# Patient Record
Sex: Male | Born: 1985 | Race: White | Hispanic: No | Marital: Single | Smoking: Current every day smoker
Health system: Southern US, Community
[De-identification: ages and names within clinical notes are randomized; demographics above are authoritative.]

## PROBLEM LIST (undated history)

## (undated) DIAGNOSIS — F32A Depression, unspecified: Secondary | ICD-10-CM

## (undated) DIAGNOSIS — F329 Major depressive disorder, single episode, unspecified: Secondary | ICD-10-CM

## (undated) DIAGNOSIS — F419 Anxiety disorder, unspecified: Secondary | ICD-10-CM

## (undated) DIAGNOSIS — R9431 Abnormal electrocardiogram [ECG] [EKG]: Secondary | ICD-10-CM

---
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
## 2012-07-30 ENCOUNTER — Encounter (HOSPITAL_BASED_OUTPATIENT_CLINIC_OR_DEPARTMENT_OTHER): Payer: Self-pay | Admitting: *Deleted

## 2012-07-30 ENCOUNTER — Emergency Department (HOSPITAL_BASED_OUTPATIENT_CLINIC_OR_DEPARTMENT_OTHER)
Admission: EM | Admit: 2012-07-30 | Discharge: 2012-07-30 | Disposition: A | Discharge status: Home / Self Care | Payer: BC Managed Care – PPO | Attending: Emergency Medicine | Admitting: Emergency Medicine

## 2012-07-30 ENCOUNTER — Emergency Department (HOSPITAL_BASED_OUTPATIENT_CLINIC_OR_DEPARTMENT_OTHER): Payer: BC Managed Care – PPO

## 2012-07-30 DIAGNOSIS — A084 Viral intestinal infection, unspecified: Secondary | ICD-10-CM

## 2012-07-30 DIAGNOSIS — F17200 Nicotine dependence, unspecified, uncomplicated: Secondary | ICD-10-CM | POA: Insufficient documentation

## 2012-07-30 DIAGNOSIS — R197 Diarrhea, unspecified: Secondary | ICD-10-CM | POA: Insufficient documentation

## 2012-07-30 DIAGNOSIS — R509 Fever, unspecified: Secondary | ICD-10-CM | POA: Insufficient documentation

## 2012-07-30 DIAGNOSIS — J029 Acute pharyngitis, unspecified: Secondary | ICD-10-CM | POA: Insufficient documentation

## 2012-07-30 DIAGNOSIS — A088 Other specified intestinal infections: Secondary | ICD-10-CM | POA: Insufficient documentation

## 2012-07-30 LAB — CBC WITH DIFFERENTIAL/PLATELET
Basophils Absolute: 0 10*3/uL (ref 0.0–0.1)
Basophils Relative: 0 % (ref 0–1)
Eosinophils Absolute: 0 10*3/uL (ref 0.0–0.7)
Eosinophils Relative: 0 % (ref 0–5)
HCT: 45 % (ref 39.0–52.0)
Hemoglobin: 15.1 g/dL (ref 13.0–17.0)
Lymphocytes Relative: 10 % — ABNORMAL LOW (ref 12–46)
Lymphs Abs: 1.8 10*3/uL (ref 0.7–4.0)
MCH: 29.2 pg (ref 26.0–34.0)
MCHC: 33.6 g/dL (ref 30.0–36.0)
MCV: 86.9 fL (ref 78.0–100.0)
Monocytes Absolute: 1.2 10*3/uL — ABNORMAL HIGH (ref 0.1–1.0)
Monocytes Relative: 7 % (ref 3–12)
Neutro Abs: 14.2 10*3/uL — ABNORMAL HIGH (ref 1.7–7.7)
Neutrophils Relative %: 83 % — ABNORMAL HIGH (ref 43–77)
Platelets: 272 10*3/uL (ref 150–400)
RBC: 5.18 MIL/uL (ref 4.22–5.81)
RDW: 14 % (ref 11.5–15.5)
WBC: 17.3 10*3/uL — ABNORMAL HIGH (ref 4.0–10.5)

## 2012-07-30 LAB — URINALYSIS, ROUTINE W REFLEX MICROSCOPIC
Glucose, UA: NEGATIVE mg/dL
Hgb urine dipstick: NEGATIVE
Ketones, ur: 15 mg/dL — AB
Leukocytes, UA: NEGATIVE
Protein, ur: NEGATIVE mg/dL
pH: 7 (ref 5.0–8.0)

## 2012-07-30 LAB — COMPREHENSIVE METABOLIC PANEL
ALT: 10 U/L (ref 0–53)
AST: 16 U/L (ref 0–37)
Albumin: 4.2 g/dL (ref 3.5–5.2)
Alkaline Phosphatase: 68 U/L (ref 39–117)
BUN: 17 mg/dL (ref 6–23)
CO2: 26 mEq/L (ref 19–32)
Calcium: 10.3 mg/dL (ref 8.4–10.5)
Chloride: 98 mEq/L (ref 96–112)
Creatinine, Ser: 0.7 mg/dL (ref 0.50–1.35)
GFR calc Af Amer: >90 mL/min (ref >90)
GFR calc non Af Amer: >90 mL/min (ref >90)
Glucose, Bld: 143 mg/dL — ABNORMAL HIGH (ref 70–99)
Potassium: 3.4 mEq/L — ABNORMAL LOW (ref 3.5–5.1)
Sodium: 138 mEq/L (ref 135–145)
Total Bilirubin: 0.5 mg/dL (ref 0.3–1.2)
Total Protein: 7.7 g/dL (ref 6.0–8.3)

## 2012-07-30 MED ORDER — ONDANSETRON HCL 4 MG/2ML IJ SOLN
4.0000 mg | Freq: Once | INTRAMUSCULAR | Status: AC
  Administered 2012-07-30: 4 mg via INTRAVENOUS
  Filled 2012-07-30: qty 2

## 2012-07-30 MED ORDER — ONDANSETRON HCL 4 MG PO TABS: 4.0000 mg | ORAL_TABLET | Freq: Three times a day (TID) | ORAL | Status: DC | PRN

## 2012-07-30 MED ORDER — SODIUM CHLORIDE 0.9 % IV BOLUS (SEPSIS)
1000.0000 mL | Freq: Once | INTRAVENOUS | Status: AC
  Administered 2012-07-30: 1000 mL via INTRAVENOUS

## 2012-07-30 MED ORDER — ONDANSETRON HCL 4 MG/2ML IJ SOLN: 4.0000 mg | Freq: Once | INTRAMUSCULAR | Status: AC

## 2012-07-30 MED ORDER — IOHEXOL 300 MG/ML  SOLN
100.0000 mL | Freq: Once | INTRAMUSCULAR | Status: AC | PRN
  Administered 2012-07-30: 100 mL via INTRAVENOUS

## 2012-07-30 MED ORDER — PROMETHAZINE HCL 25 MG/ML IJ SOLN: 12.5000 mg | INTRAMUSCULAR | Status: DC | PRN

## 2012-07-30 MED ORDER — ONDANSETRON HCL 4 MG/2ML IJ SOLN
INTRAMUSCULAR | Status: AC
  Administered 2012-07-30: 4 mg via INTRAVENOUS
  Filled 2012-07-30: qty 2

## 2012-07-30 MED ORDER — PROMETHAZINE HCL 25 MG/ML IJ SOLN
INTRAMUSCULAR | Status: AC
  Administered 2012-07-30: 12.5 mg via INTRAVENOUS
  Filled 2012-07-30: qty 1

## 2012-07-30 MED ORDER — SODIUM CHLORIDE 0.9 % IV SOLN
Freq: Once | INTRAVENOUS | Status: AC
  Administered 2012-07-30: 23:00:00 via INTRAVENOUS

## 2012-07-30 NOTE — ED Notes (Signed)
MD at bedside. 

## 2012-07-30 NOTE — ED Provider Notes (Signed)
History  This chart was scribed for Edward Shi, MD by Edward Mayer, ED Scribe. This patient was seen in room MH10/MH10 and the patient's care was started at 9:24 PM.  CSN: [id number]  Arrival date & time 07/30/12  2116   Chief Complaint  Patient presents with  . Emesis    The history is provided by the patient. No language interpreter was used.   HPI Comments: ABBIE Mayer is a 27 y.o. Male without significant PMH who presents to the Emergency Department complaining of persistent nausea and vomiting of 3 days duration. Pt reports more than 10 episodes of emesis per day for the last 3 days. Pt also states that earlier today he noticed blood streaks in emesis, but he believes this is due to throat irritation. He reports associated fever, diarrhea and sore throat over the last 3 days. Tmax at home was 101 F. Triage Temp is 99.1 F. Pt states that his last episode of diarrhea was yesterday. He denies sick contacts. He denies having any chronic medical conditions and states that he takes no daily medications. He denies abdominal pain, rash, headaches or any other symptoms. Pt denies alcohol use and is a current every day smoker.  PCP- None  History reviewed. No pertinent past medical history.  History reviewed. No pertinent past surgical history.  No family history on file.  History  Substance Use Topics  . Smoking status: Current Every Day Smoker    Types: Cigarettes  . Smokeless tobacco: Never Used  . Alcohol Use: No    Review of Systems  Constitutional: Positive for fever.  HENT: Positive for sore throat.   Gastrointestinal: Positive for nausea, vomiting and diarrhea. Negative for abdominal pain.  Skin: Negative for rash.  Neurological: Negative for headaches.   A complete 10 system review of systems was obtained and all systems are negative except as noted in the HPI and PMH.   Allergies  Review of patient's allergies indicates no known allergies.  Home Medications    Current Outpatient Rx  Name  Route  Sig  Dispense  Refill  . ondansetron (ZOFRAN) 4 MG tablet   Oral   Take 1 tablet (4 mg total) by mouth every 8 (eight) hours as needed for nausea.   20 tablet   0     Triage Vitals: BP 155/95  Pulse 63  Temp(Src) 99.1 F (37.3 C) (Oral)  Resp 20  Wt 150 lb (68.04 kg)  SpO2 98%  Physical Exam  Nursing note and vitals reviewed. Constitutional: He is oriented to person, place, and time. He appears well-developed and well-nourished. No distress.  HENT:  Head: Normocephalic and atraumatic.  Mouth/Throat: No oropharyngeal exudate.  Eyes: Pupils are equal, round, and reactive to light.  Neck: Normal range of motion. Neck supple.  Cardiovascular: Normal rate and intact distal pulses.   Pulmonary/Chest: No respiratory distress.  Abdominal: Soft. Normal appearance and bowel sounds are normal. He exhibits no distension. There is no tenderness. There is no rebound and no guarding.  Musculoskeletal: Normal range of motion.  Neurological: He is alert and oriented to person, place, and time. No cranial nerve deficit.  Skin: Skin is warm and dry. No rash noted.  Psychiatric: He has a normal mood and affect. His behavior is normal.    ED Course  Procedures (including critical care time) Medications  sodium chloride 0.9 % bolus 1,000 mL (0 mLs Intravenous Stopped 07/30/12 2212)  ondansetron (ZOFRAN) injection 4 mg (4 mg Intravenous Given  07/30/12 2138)  sodium chloride 0.9 % bolus 1,000 mL (0 mLs Intravenous Stopped 07/30/12 2303)  ondansetron (ZOFRAN) injection 4 mg (4 mg Intravenous Given 07/30/12 2239)  iohexol (OMNIPAQUE) 300 MG/ML solution 100 mL (100 mLs Intravenous Contrast Given 07/30/12 2301)    DIAGNOSTIC STUDIES: Oxygen Saturation is 98% on RA, normal by my interpretation.    COORDINATION OF CARE: 9:27 PM- Pt advised of plan for treatment with IV fluids and ant-nausea medication and pt agrees.  Medications  sodium chloride 0.9 % bolus  1,000 mL (0 mLs Intravenous Stopped 07/30/12 2212)  ondansetron (ZOFRAN) injection 4 mg (4 mg Intravenous Given 07/30/12 2138)  sodium chloride 0.9 % bolus 1,000 mL (0 mLs Intravenous Stopped 07/30/12 2303)  ondansetron (ZOFRAN) injection 4 mg (4 mg Intravenous Given 07/30/12 2239)  iohexol (OMNIPAQUE) 300 MG/ML solution 100 mL (100 mLs Intravenous Contrast Given 07/30/12 2301)    Labs Reviewed  CBC WITH DIFFERENTIAL - Abnormal; Notable for the following:    WBC 17.3 (*)    Neutrophils Relative % 83 (*)    Neutro Abs 14.2 (*)    Lymphocytes Relative 10 (*)    Monocytes Absolute 1.2 (*)    All other components within normal limits  COMPREHENSIVE METABOLIC PANEL - Abnormal; Notable for the following:    Potassium 3.4 (*)    Glucose, Bld 143 (*)    All other components within normal limits  URINALYSIS, ROUTINE W REFLEX MICROSCOPIC - Abnormal; Notable for the following:    Color, Urine AMBER (*)    Specific Gravity, Urine 1.034 (*)    Bilirubin Urine SMALL (*)    Ketones, ur 15 (*)    All other components within normal limits  LIPASE, BLOOD    Ct Abdomen Pelvis W Contrast  07/30/2012   *RADIOLOGY REPORT*  Clinical Data: Nausea, vomiting and abdominal pain.  Leukocytosis.  CT ABDOMEN AND PELVIS WITH CONTRAST  Technique:  Multidetector CT imaging of the abdomen and pelvis was performed following the standard protocol during bolus administration of intravenous contrast.  Contrast: OMNIPAQUE IOHEXOL 300 MG/ML  SOLN  Comparison: None.  Findings: The visualized lung bases are clear.  The liver and spleen are unremarkable in appearance.  The gallbladder is within normal limits.  The pancreas and adrenal glands are unremarkable.  The kidneys are unremarkable in appearance.  There is no evidence of hydronephrosis.  No renal or ureteral stones are seen.  No perinephric stranding is appreciated.  No free fluid is identified.  The small bowel is unremarkable in appearance.  The stomach is within normal  limits.  No acute vascular abnormalities are seen.  The appendix is normal in caliber, extending into the pelvis. There is no evidence for appendicitis.  The colon is unremarkable in appearance.  The bladder is mildly distended and grossly unremarkable.  The prostate remains normal in size.  No inguinal lymphadenopathy is seen.  No acute osseous abnormalities are identified.  IMPRESSION: Unremarkable contrast-enhanced CT of the abdomen and pelvis.   Original Report Authenticated By: Tonia Ghent, M.D.    1. Viral gastroenteritis     MDM  After treatment in the ED the patient feels back to baseline and wants to go home.       I personally performed the services described in this documentation, which was scribed in my presence. The recorded information has been reviewed and is accurate.     Edward Shi, MD 08/10/12 [phone]

## 2012-07-30 NOTE — ED Notes (Signed)
Reports vomiting since thursday

## 2012-07-30 NOTE — ED Notes (Signed)
Patient transported to CT 

## 2012-07-30 NOTE — ED Notes (Signed)
MD at bedside discussing test results and tx options. Marland Kitchen

## 2013-09-02 ENCOUNTER — Encounter (HOSPITAL_COMMUNITY): Payer: Self-pay | Admitting: Emergency Medicine

## 2013-09-02 ENCOUNTER — Emergency Department (HOSPITAL_COMMUNITY)
Admission: EM | Admit: 2013-09-02 | Discharge: 2013-09-02 | Disposition: A | Discharge status: Home / Self Care | Payer: BC Managed Care – PPO | Attending: Emergency Medicine | Admitting: Emergency Medicine

## 2013-09-02 DIAGNOSIS — R112 Nausea with vomiting, unspecified: Secondary | ICD-10-CM | POA: Insufficient documentation

## 2013-09-02 DIAGNOSIS — R1084 Generalized abdominal pain: Secondary | ICD-10-CM | POA: Insufficient documentation

## 2013-09-02 DIAGNOSIS — F17200 Nicotine dependence, unspecified, uncomplicated: Secondary | ICD-10-CM | POA: Insufficient documentation

## 2013-09-02 LAB — CBC WITH DIFFERENTIAL/PLATELET
BASOS ABS: 0 10*3/uL (ref 0.0–0.1)
BASOS PCT: 0 % (ref 0–1)
EOS ABS: 0 10*3/uL (ref 0.0–0.7)
Eosinophils Relative: 0 % (ref 0–5)
HCT: 47.5 % (ref 39.0–52.0)
Hemoglobin: 16.4 g/dL (ref 13.0–17.0)
LYMPHS ABS: 1.7 10*3/uL (ref 0.7–4.0)
Lymphocytes Relative: 15 % (ref 12–46)
MCH: 29.5 pg (ref 26.0–34.0)
MCHC: 34.5 g/dL (ref 30.0–36.0)
MCV: 85.6 fL (ref 78.0–100.0)
Monocytes Absolute: 0.8 10*3/uL (ref 0.1–1.0)
Monocytes Relative: 7 % (ref 3–12)
NEUTROS PCT: 78 % — AB (ref 43–77)
Neutro Abs: 8.7 10*3/uL — ABNORMAL HIGH (ref 1.7–7.7)
PLATELETS: 297 10*3/uL (ref 150–400)
RBC: 5.55 MIL/uL (ref 4.22–5.81)
RDW: 13.5 % (ref 11.5–15.5)
WBC: 11.1 10*3/uL — ABNORMAL HIGH (ref 4.0–10.5)

## 2013-09-02 LAB — COMPREHENSIVE METABOLIC PANEL
ALBUMIN: 4.8 g/dL (ref 3.5–5.2)
ALK PHOS: 68 U/L (ref 39–117)
ALT: 14 U/L (ref 0–53)
AST: 15 U/L (ref 0–37)
Anion gap: 20 — ABNORMAL HIGH (ref 5–15)
BUN: 20 mg/dL (ref 6–23)
CO2: 24 mEq/L (ref 19–32)
Calcium: 10.2 mg/dL (ref 8.4–10.5)
Chloride: 98 mEq/L (ref 96–112)
Creatinine, Ser: 0.88 mg/dL (ref 0.50–1.35)
GFR calc Af Amer: >90 mL/min (ref >90)
GFR calc non Af Amer: >90 mL/min (ref >90)
Glucose, Bld: 128 mg/dL — ABNORMAL HIGH (ref 70–99)
POTASSIUM: 3.4 meq/L — AB (ref 3.7–5.3)
SODIUM: 142 meq/L (ref 137–147)
TOTAL PROTEIN: 8.5 g/dL — AB (ref 6.0–8.3)
Total Bilirubin: 0.5 mg/dL (ref 0.3–1.2)

## 2013-09-02 LAB — LIPASE, BLOOD: Lipase: 64 U/L — ABNORMAL HIGH (ref 11–59)

## 2013-09-02 MED ORDER — ONDANSETRON 8 MG PO TBDP
8.0000 mg | ORAL_TABLET | Freq: Once | ORAL | Status: AC
  Administered 2013-09-02: 8 mg via ORAL
  Filled 2013-09-02: qty 1

## 2013-09-02 MED ORDER — ONDANSETRON HCL 4 MG/2ML IJ SOLN
4.0000 mg | Freq: Once | INTRAMUSCULAR | Status: AC
  Administered 2013-09-02: 4 mg via INTRAVENOUS
  Filled 2013-09-02: qty 2

## 2013-09-02 MED ORDER — SODIUM CHLORIDE 0.9 % IV BOLUS (SEPSIS)
1000.0000 mL | Freq: Once | INTRAVENOUS | Status: AC
  Administered 2013-09-02: 1000 mL via INTRAVENOUS

## 2013-09-02 MED ORDER — LORAZEPAM 2 MG/ML IJ SOLN
1.0000 mg | Freq: Once | INTRAMUSCULAR | Status: AC
  Administered 2013-09-02: 1 mg via INTRAVENOUS
  Filled 2013-09-02: qty 1

## 2013-09-02 MED ORDER — KETOROLAC TROMETHAMINE 30 MG/ML IJ SOLN
30.0000 mg | Freq: Once | INTRAMUSCULAR | Status: AC
  Administered 2013-09-02: 30 mg via INTRAVENOUS
  Filled 2013-09-02: qty 1

## 2013-09-02 MED ORDER — DICYCLOMINE HCL 10 MG/ML IM SOLN
20.0000 mg | Freq: Once | INTRAMUSCULAR | Status: AC
  Administered 2013-09-02: 20 mg via INTRAMUSCULAR
  Filled 2013-09-02: qty 2

## 2013-09-02 MED ORDER — ONDANSETRON 4 MG PO TBDP: 4.0000 mg | ORAL_TABLET | Freq: Three times a day (TID) | ORAL | Status: DC | PRN

## 2013-09-02 NOTE — ED Notes (Signed)
Bed: [facility] Expected date: 09/02/13 Expected time: 2:53 AM Means of arrival: Ambulance Comments: abd pain

## 2013-09-02 NOTE — ED Provider Notes (Signed)
Pt feels better,  Pain has improved.  Pt feels like he can drink fluids,  Labs reviewed   Wbc's 11.1  Lipase 64.   (probable viral illness, I advised clear liquids x 12 then progress diet.   Rx for phenenergan  Elson AreasLeslie K Hawley Michel, PA-C 09/02/13 [id number]

## 2013-09-02 NOTE — ED Provider Notes (Signed)
CSN: [id number]     Arrival date & time 09/02/13  0303 History   First MD Initiated Contact with Patient 09/02/13 [phone]     Chief Complaint  Patient presents with  . Abdominal Pain  . Nausea  . Emesis     (Consider location/radiation/quality/duration/timing/severity/associated sxs/prior Treatment) Patient is a 28 y.o. male presenting with abdominal pain and vomiting. The history is provided by the patient.  Abdominal Pain Pain location:  Generalized Pain quality: cramping   Pain radiates to:  Does not radiate Pain severity:  Moderate Onset quality:  Gradual Duration:  3 days Timing:  Constant Progression:  Unchanged Chronicity:  New Context: retching   Context: not alcohol use   Relieved by:  None tried Ineffective treatments:  Eating Associated symptoms: nausea and vomiting   Associated symptoms: no cough, no diarrhea and no fever   Emesis Associated symptoms: abdominal pain   Associated symptoms: no diarrhea and no myalgias     History reviewed. No pertinent past medical history. History reviewed. No pertinent past surgical history. No family history on file. History  Substance Use Topics  . Smoking status: Current Every Day Smoker    Types: Cigarettes  . Smokeless tobacco: Never Used  . Alcohol Use: No    Review of Systems  Constitutional: Negative for fever.  Respiratory: Negative for cough.   Gastrointestinal: Positive for nausea, vomiting and abdominal pain. Negative for diarrhea.  Musculoskeletal: Negative for myalgias.  All other systems reviewed and are negative.     Allergies  Review of patient's allergies indicates no known allergies.  Home Medications   Prior to Admission medications   Medication Sig Start Date End Date Taking? Authorizing Provider  bismuth subsalicylate (PEPTO BISMOL) 262 MG/15ML suspension Take 30 mLs by mouth every 6 (six) hours as needed for indigestion.   Yes Historical Provider, MD   BP 149/73  Pulse 63  Temp(Src)  99.2 F (37.3 C) (Oral)  Resp 23  Ht 5\' 5"  (1.651 m)  Wt 150 lb (68.04 kg)  BMI 24.96 kg/m2  SpO2 97% Physical Exam  Nursing note and vitals reviewed. Constitutional: He appears well-nourished. No distress.  HENT:  Head: Normocephalic.  Mouth/Throat: Oropharynx is clear and moist.  Eyes: Pupils are equal, round, and reactive to light.  Neck: Normal range of motion.  Cardiovascular: Normal rate.   Pulmonary/Chest: Effort normal.  Abdominal: Soft. Bowel sounds are normal. He exhibits no distension. There is generalized tenderness.  Musculoskeletal: Normal range of motion.  Neurological: He is alert.  Skin: Skin is warm and dry. No rash noted. No erythema.    ED Course  Procedures (including critical care time) Labs Review Labs Reviewed  CBC WITH DIFFERENTIAL - Abnormal; Notable for the following:    WBC 11.1 (*)    Neutrophils Relative % 78 (*)    Neutro Abs 8.7 (*)    All other components within normal limits  COMPREHENSIVE METABOLIC PANEL - Abnormal; Notable for the following:    Potassium 3.4 (*)    Glucose, Bld 128 (*)    Total Protein 8.5 (*)    Anion gap 20 (*)    All other components within normal limits  LIPASE, BLOOD - Abnormal; Notable for the following:    Lipase 64 (*)    All other components within normal limits    Imaging Review No results found.   EKG Interpretation None      MDM   Final diagnoses:  None     After 500 cc  IV fluids, Toradol and Zofran IV patient still have nausea/vomiting and cramping pain  Will give IV Ativan and IM Bentyl     Arman FilterGail K Camaria Gerald, NP 09/02/13 [id number]

## 2013-09-02 NOTE — ED Notes (Signed)
Bed: [facility]03 Expected date:  Expected time:  Means of arrival:  Comments: 

## 2013-09-02 NOTE — Discharge Instructions (Signed)

## 2013-09-02 NOTE — ED Provider Notes (Signed)
Medical screening examination/treatment/procedure(s) were performed by non-physician practitioner and as supervising physician I was immediately available for consultation/collaboration.   EKG Interpretation None        Loren Raceravid Bodee Lafoe, MD 09/02/13 [phone]

## 2013-09-02 NOTE — ED Notes (Addendum)
Pt reports n/v/d, lower quadrants abd pain since Thursday.

## 2013-09-03 NOTE — ED Provider Notes (Signed)
Medical screening examination/treatment/procedure(s) were performed by non-physician practitioner and as supervising physician I was immediately available for consultation/collaboration.   EKG Interpretation None        Loren Racer, MD 09/03/13 0700

## 2015-04-03 ENCOUNTER — Encounter (HOSPITAL_COMMUNITY): Payer: Self-pay | Admitting: Emergency Medicine

## 2015-04-03 ENCOUNTER — Emergency Department (HOSPITAL_COMMUNITY)
Admission: EM | Admit: 2015-04-03 | Discharge: 2015-04-03 | Disposition: A | Discharge status: Home / Self Care | Payer: Self-pay | Attending: Emergency Medicine | Admitting: Emergency Medicine

## 2015-04-03 ENCOUNTER — Emergency Department (HOSPITAL_COMMUNITY): Payer: Self-pay

## 2015-04-03 DIAGNOSIS — Z8679 Personal history of other diseases of the circulatory system: Secondary | ICD-10-CM | POA: Insufficient documentation

## 2015-04-03 DIAGNOSIS — R001 Bradycardia, unspecified: Secondary | ICD-10-CM | POA: Insufficient documentation

## 2015-04-03 DIAGNOSIS — F17210 Nicotine dependence, cigarettes, uncomplicated: Secondary | ICD-10-CM | POA: Insufficient documentation

## 2015-04-03 DIAGNOSIS — R079 Chest pain, unspecified: Secondary | ICD-10-CM | POA: Insufficient documentation

## 2015-04-03 HISTORY — DX: Abnormal electrocardiogram (ECG) (EKG): R94.31

## 2015-04-03 LAB — BASIC METABOLIC PANEL
ANION GAP: 11 (ref 5–15)
BUN: 15 mg/dL (ref 6–20)
CHLORIDE: 108 mmol/L (ref 101–111)
CO2: 23 mmol/L (ref 22–32)
Calcium: 9.1 mg/dL (ref 8.9–10.3)
Creatinine, Ser: 0.84 mg/dL (ref 0.61–1.24)
GFR calc Af Amer: >60 mL/min (ref >60)
GFR calc non Af Amer: >60 mL/min (ref >60)
Glucose, Bld: 132 mg/dL — ABNORMAL HIGH (ref 65–99)
POTASSIUM: 4.7 mmol/L (ref 3.5–5.1)
Sodium: 142 mmol/L (ref 135–145)

## 2015-04-03 LAB — I-STAT TROPONIN, ED
TROPONIN I, POC: 0 ng/mL (ref 0.00–0.08)
TROPONIN I, POC: 0 ng/mL (ref 0.00–0.08)

## 2015-04-03 LAB — CBC
HEMATOCRIT: 41.7 % (ref 39.0–52.0)
Hemoglobin: 13.5 g/dL (ref 13.0–17.0)
MCH: 28.5 pg (ref 26.0–34.0)
MCHC: 32.4 g/dL (ref 30.0–36.0)
MCV: 88.2 fL (ref 78.0–100.0)
Platelets: 302 10*3/uL (ref 150–400)
RBC: 4.73 MIL/uL (ref 4.22–5.81)
RDW: 13.9 % (ref 11.5–15.5)
WBC: 7.3 10*3/uL (ref 4.0–10.5)

## 2015-04-03 LAB — LIPASE, BLOOD: Lipase: 22 U/L (ref 11–51)

## 2015-04-03 MED ORDER — PROMETHAZINE HCL 12.5 MG PO TABS: 12.5000 mg | ORAL_TABLET | Freq: Three times a day (TID) | ORAL | Status: DC | PRN

## 2015-04-03 MED ORDER — PROMETHAZINE HCL 25 MG/ML IJ SOLN
12.5000 mg | Freq: Once | INTRAMUSCULAR | Status: AC
  Administered 2015-04-03: 12.5 mg via INTRAVENOUS
  Filled 2015-04-03: qty 1

## 2015-04-03 MED ORDER — PANTOPRAZOLE SODIUM 20 MG PO TBEC
20.0000 mg | DELAYED_RELEASE_TABLET | Freq: Once | ORAL | Status: DC
  Filled 2015-04-03: qty 1

## 2015-04-03 NOTE — ED Notes (Signed)
Cp x 2 days pressure type non radiating with some nausea hx of prolonged QT has 20 left ac given 4 zofran iv and 1 nitro per ems pt admits to smoking weed today and has used drugs coke  in the past

## 2015-04-03 NOTE — ED Notes (Signed)
PA at bedside updating patient/family 

## 2015-04-03 NOTE — ED Notes (Signed)
Pt has had 2 episodes of vomiting a red substance. If another instance occurs will do a gastrocult.

## 2015-04-03 NOTE — Discharge Instructions (Signed)
Please read and follow all provided instructions.  Your diagnoses today include:  1. Chest pain, unspecified chest pain type    Tests performed today include:  An EKG of your heart  A chest x-ray  Cardiac enzymes - a blood test for heart muscle damage  Blood counts and electrolytes  Vital signs. See below for your results today.   Medications prescribed:   Take any prescribed medications only as directed.  Follow-up instructions: Please follow-up with your Cardiologist at scheduled appointment for further evaluation of your symptoms.   Return instructions:  SEEK IMMEDIATE MEDICAL ATTENTION IF:  You have severe chest pain, especially if the pain is crushing or pressure-like and spreads to the arms, back, neck, or jaw, or if you have sweating, nausea (feeling sick to your stomach), or shortness of breath. THIS IS AN EMERGENCY. Don't wait to see if the pain will go away. Get medical help at once. Call 911 or 0 (operator). DO NOT drive yourself to the hospital.   Your chest pain gets worse and does not go away with rest.   You have an attack of chest pain lasting longer than usual, despite rest and treatment with the medications your caregiver has prescribed.   You wake from sleep with chest pain or shortness of breath.  You feel dizzy or faint.  You have chest pain not typical of your usual pain for which you originally saw your caregiver.   You have any other emergent concerns regarding your health.  Additional Information: Chest pain comes from many different causes. Your caregiver has diagnosed you as having chest pain that is not specific for one problem, but does not require admission.  You are at low risk for an acute heart condition or other serious illness.   Your vital signs today were: BP 116/72 mmHg   Pulse 45   Resp 14   SpO2 100% If your blood pressure (BP) was elevated above 135/85 this visit, please have this repeated by your doctor within one  month. --------------

## 2015-04-03 NOTE — ED Provider Notes (Signed)
CSN: [id number]     Arrival date & time 04/03/15  1232 History   First MD Initiated Contact with Patient 04/03/15 1304     Chief Complaint  Patient presents with  . 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 Noticed while he was sitting on the couch. Notes pressure. Substernal with no radiation. 10/10 pain. Intermittent. Unsure of duration. No DM, HTN, HLD. Does endorse smoking 1 PPD. Significant FH of MI. EMS given Nitro and Zofran en route. Pt states pain improved with Nitro. No pain currently in ED. No diaphoresis. Notes N/V. No hematemesis. No SOB/ABD pain. No headache. No other symptoms noted.    Past Medical History  Diagnosis Date  . Prolonged QT interval    History reviewed. No pertinent past surgical history. No family history on file. Social History  Substance Use Topics  . Smoking status: Current Every Day Smoker    Types: Cigarettes  . Smokeless tobacco: Never Used  . Alcohol Use: No    Review of Systems ROS reviewed and all are negative for acute change except as noted in the HPI.  Allergies  Review of patient's allergies indicates no known allergies.  Home Medications   Prior to Admission medications   Medication Sig Start Date End Date Taking? Authorizing Provider  bismuth subsalicylate (PEPTO BISMOL) 262 MG/15ML suspension Take 30 mLs by mouth every 6 (six) hours as needed for indigestion.    Historical Provider, MD  ondansetron (ZOFRAN ODT) 4 MG disintegrating tablet Take 1 tablet (4 mg total) by mouth every 8 (eight) hours as needed for nausea or vomiting. 09/02/13   Elson AreasLeslie K Sofia, PA-C   BP 131/84 mmHg  Pulse 44  Resp 21  SpO2 100%   Physical Exam  Constitutional: He is oriented to person, place, and time. He appears well-developed and well-nourished.   HENT:  Head: Normocephalic and atraumatic.  Eyes: EOM are normal. Pupils are equal, round, and reactive to light.  Neck: Normal range of motion. Neck supple. No tracheal deviation present.  Cardiovascular: Regular rhythm, S1 normal, S2 normal, normal heart sounds, intact distal pulses and normal pulses.  Bradycardia present.   No murmur heard. Pulmonary/Chest: Effort normal and breath sounds normal. No respiratory distress. He has no wheezes. He has no rales. He exhibits no tenderness.  Abdominal: Soft. There is no tenderness.  Musculoskeletal: Normal range of motion.  Neurological: He is alert and oriented to person, place, and time.  Skin: Skin is warm and dry.  Psychiatric: He has a normal mood and affect. His behavior is normal. Thought content normal.  Nursing note and vitals reviewed.   ED Course  Procedures (including critical care time) Labs Review Labs Reviewed  BASIC METABOLIC PANEL - Abnormal; Notable for the following:    Glucose, Bld 132 (*)    All other components within normal limits  CBC  LIPASE, BLOOD  I-STAT TROPOININ, ED  Rosezena SensorI-STAT TROPOININ, ED   Imaging Review Dg Chest 2 View  04/03/2015  CLINICAL DATA:  Chest pain for 2 days.  Cough. EXAM: CHEST  2 VIEW COMPARISON:  None. FINDINGS: The lungs are clear. Heart size and pulmonary vascularity are normal. No adenopathy. No bone lesions. No pneumothorax. IMPRESSION: No edema or consolidation. Electronically Signed   By: Bretta BangWilliam  Woodruff III M.D.   On: 04/03/2015 14:28   I have personally reviewed  and evaluated these images and lab results as part of my medical decision-making.   EKG Interpretation   Date/Time:  Wednesday April 03 2015 12:38:56 EDT Ventricular Rate:  46 PR Interval:  140 QRS Duration: 130 QT Interval:  528 QTC Calculation: 462 R Axis:   5 Text Interpretation:  Sinus bradycardia Atrial premature complex  Nonspecific intraventricular conduction delay ST elev, probable normal  early repol  pattern No old tracing to compare Confirmed by BELFI  MD,  MELANIE ([id number]) on 04/03/2015 1:49:18 PM      MDM  I have reviewed and evaluated the relevant laboratory values.I have reviewed and evaluated the relevant imaging studies.I personally evaluated and interpreted the relevant EKG.I have reviewed the relevant previous healthcare records.I have reviewed EMS Documentation.I obtained HPI from historian. Patient discussed with supervising physician  ED Course:  Assessment: Pt is a 29yM presents with CP x2days. Intermittent. Substernal no radiation. Risk Factors Smoking and FH. Given Nitro by EMS with relief of symptoms. No CP in ED. Chest pain is not likely of cardiac or pulmonary etiology d/t presentation, perc negative, VS show bradycardia in high 40s. no tracheal deviation, no JVD or new murmur, RRR, breath sounds equal bilaterally, EKG without acute abnormalities, but shows QT prolongation, negative troponinx2, and negative CXR. Of note, pt had just started methadone after being off x4 days. Zofran and Methadone administration could explain prolonged QT and N/V symptoms. Reported hx of QT prolongation from nurse has appointment with Dr. Algie Coffer tomorrow due to prolong QT. Heart Score 3. Plan is to DC Home with follow up to Dr. Algie Coffer tomorrow. Strict return precautions to the ED is CP becomes exertional, associated with diaphoresis or nausea, radiates to left jaw/arm, worsens or becomes concerning in any way. Pt appears reliable for follow up and is agreeable to discharge. Patient is in no acute distress. Well appearing. Vital Signs are stable. Patient is able to ambulate. Patient able to tolerate PO.  3:37 PM- Spoke with Cardiology. Discussed bradycardia and QT interval. Most likely due to Methadone. Low concern for ACS or admission. Has scheduled follow up with Cardiology tomorrow with Dr. Algie Coffer. Agreed to [place] with DC to Cardiology appointment.     Disposition/Plan:  DC Home Additional  Verbal discharge instructions given and discussed with patient.  Pt Instructed to f/u with PCP in the next 48-72 hours for evaluation and treatment of symptoms. Return precautions given Pt acknowledges and agrees with plan  Supervising Physician Rolan Bucco, MD   Final diagnoses:  Chest pain, unspecified chest pain type       Audry Pili, PA-C 04/03/15 1812  Rolan Bucco, MD 04/05/15 1515

## 2015-04-03 NOTE — Progress Notes (Signed)
Called by Audry Piliyler Mohr, PA-C from the ER. Pt on methadone with prolonged QT and bradycardia. He is asymptomatic from a cardiac perspective. EKG reviewed showing prolonged QT but QTc only mildly prolonged. Will arrange office visit next 1-2 weeks to repeat EKG and make sure QT interval is stable.   Edward Mayer, Edward Mayer 04/03/2015 3:40 PM

## 2015-04-03 NOTE — ED Notes (Signed)
Pt returned from xray with a bag halfway full of vomit.

## 2015-04-03 NOTE — ED Notes (Signed)
Patent able to dress and ambulate independently

## 2015-04-03 NOTE — ED Notes (Signed)
Pt states he does not think he will be able to keep the Protonix oral down.  Will provide after Phenergan has the opportunity to work

## 2015-06-28 DIAGNOSIS — F17210 Nicotine dependence, cigarettes, uncomplicated: Secondary | ICD-10-CM | POA: Insufficient documentation

## 2015-06-28 DIAGNOSIS — F1990 Other psychoactive substance use, unspecified, uncomplicated: Secondary | ICD-10-CM | POA: Insufficient documentation

## 2015-06-28 DIAGNOSIS — F329 Major depressive disorder, single episode, unspecified: Secondary | ICD-10-CM | POA: Insufficient documentation

## 2015-06-28 DIAGNOSIS — F1290 Cannabis use, unspecified, uncomplicated: Secondary | ICD-10-CM | POA: Insufficient documentation

## 2015-06-28 DIAGNOSIS — R45851 Suicidal ideations: Secondary | ICD-10-CM | POA: Insufficient documentation

## 2015-06-29 ENCOUNTER — Encounter (HOSPITAL_COMMUNITY): Payer: Self-pay | Admitting: *Deleted

## 2015-06-29 ENCOUNTER — Encounter (HOSPITAL_COMMUNITY): Payer: Self-pay | Admitting: Emergency Medicine

## 2015-06-29 ENCOUNTER — Emergency Department (HOSPITAL_COMMUNITY)
Admission: EM | Admit: 2015-06-29 | Discharge: 2015-06-29 | Disposition: A | Discharge status: Other Acute Inpatient Hospital | Payer: Self-pay | Attending: Emergency Medicine | Admitting: Emergency Medicine

## 2015-06-29 ENCOUNTER — Observation Stay (HOSPITAL_COMMUNITY)
Admission: AD | Admit: 2015-06-29 | Discharge: 2015-06-29 | Disposition: A | Discharge status: Home / Self Care | Payer: Self-pay | Source: Intra-hospital | Attending: Psychiatry | Admitting: Psychiatry

## 2015-06-29 DIAGNOSIS — F329 Major depressive disorder, single episode, unspecified: Secondary | ICD-10-CM | POA: Insufficient documentation

## 2015-06-29 DIAGNOSIS — F1290 Cannabis use, unspecified, uncomplicated: Secondary | ICD-10-CM | POA: Insufficient documentation

## 2015-06-29 DIAGNOSIS — F419 Anxiety disorder, unspecified: Secondary | ICD-10-CM | POA: Insufficient documentation

## 2015-06-29 DIAGNOSIS — Y902 Blood alcohol level of 40-59 mg/100 ml: Secondary | ICD-10-CM | POA: Insufficient documentation

## 2015-06-29 DIAGNOSIS — I4581 Long QT syndrome: Secondary | ICD-10-CM | POA: Insufficient documentation

## 2015-06-29 DIAGNOSIS — R45851 Suicidal ideations: Secondary | ICD-10-CM

## 2015-06-29 DIAGNOSIS — F1994 Other psychoactive substance use, unspecified with psychoactive substance-induced mood disorder: Secondary | ICD-10-CM | POA: Diagnosis present

## 2015-06-29 DIAGNOSIS — F1094 Alcohol use, unspecified with alcohol-induced mood disorder: Principal | ICD-10-CM | POA: Insufficient documentation

## 2015-06-29 DIAGNOSIS — F1490 Cocaine use, unspecified, uncomplicated: Secondary | ICD-10-CM | POA: Insufficient documentation

## 2015-06-29 DIAGNOSIS — F1590 Other stimulant use, unspecified, uncomplicated: Secondary | ICD-10-CM | POA: Insufficient documentation

## 2015-06-29 HISTORY — DX: Major depressive disorder, single episode, unspecified: F32.9

## 2015-06-29 HISTORY — DX: Anxiety disorder, unspecified: F41.9

## 2015-06-29 HISTORY — DX: Depression, unspecified: F32.A

## 2015-06-29 LAB — RAPID URINE DRUG SCREEN, HOSP PERFORMED
AMPHETAMINES: NOT DETECTED
Barbiturates: NOT DETECTED
Benzodiazepines: NOT DETECTED
Cocaine: POSITIVE — AB
OPIATES: NOT DETECTED
Tetrahydrocannabinol: POSITIVE — AB

## 2015-06-29 LAB — COMPREHENSIVE METABOLIC PANEL
ALK PHOS: 69 U/L (ref 38–126)
ALT: 98 U/L — AB (ref 17–63)
AST: 38 U/L (ref 15–41)
Albumin: 4.4 g/dL (ref 3.5–5.0)
Anion gap: 10 (ref 5–15)
BUN: 19 mg/dL (ref 6–20)
CALCIUM: 9.4 mg/dL (ref 8.9–10.3)
CHLORIDE: 108 mmol/L (ref 101–111)
CO2: 24 mmol/L (ref 22–32)
CREATININE: 0.89 mg/dL (ref 0.61–1.24)
Glucose, Bld: 76 mg/dL (ref 65–99)
Potassium: 3.9 mmol/L (ref 3.5–5.1)
Sodium: 142 mmol/L (ref 135–145)
Total Bilirubin: 0.8 mg/dL (ref 0.3–1.2)
Total Protein: 7.8 g/dL (ref 6.5–8.1)

## 2015-06-29 LAB — URINALYSIS, ROUTINE W REFLEX MICROSCOPIC
Bilirubin Urine: NEGATIVE
Glucose, UA: NEGATIVE mg/dL
Hgb urine dipstick: NEGATIVE
Ketones, ur: NEGATIVE mg/dL
Leukocytes, UA: NEGATIVE
Nitrite: NEGATIVE
Protein, ur: NEGATIVE mg/dL
Specific Gravity, Urine: 1.023 (ref 1.005–1.030)
pH: 6.5 (ref 5.0–8.0)

## 2015-06-29 LAB — CBC
HEMATOCRIT: 46.2 % (ref 39.0–52.0)
HEMOGLOBIN: 15.7 g/dL (ref 13.0–17.0)
MCH: 28.6 pg (ref 26.0–34.0)
MCHC: 34 g/dL (ref 30.0–36.0)
MCV: 84.2 fL (ref 78.0–100.0)
Platelets: 301 10*3/uL (ref 150–400)
RBC: 5.49 MIL/uL (ref 4.22–5.81)
RDW: 13.8 % (ref 11.5–15.5)
WBC: 15.4 10*3/uL — ABNORMAL HIGH (ref 4.0–10.5)

## 2015-06-29 LAB — ACETAMINOPHEN LEVEL: Acetaminophen (Tylenol), Serum: <10 ug/mL — ABNORMAL LOW (ref 10–30)

## 2015-06-29 LAB — ETHANOL: Alcohol, Ethyl (B): 59 mg/dL — ABNORMAL HIGH (ref <5)

## 2015-06-29 LAB — SALICYLATE LEVEL

## 2015-06-29 MED ORDER — LORAZEPAM 1 MG PO TABS: 1.0000 mg | ORAL_TABLET | Freq: Three times a day (TID) | ORAL | Status: DC | PRN

## 2015-06-29 MED ORDER — IBUPROFEN 200 MG PO TABS: 600.0000 mg | ORAL_TABLET | Freq: Three times a day (TID) | ORAL | Status: DC | PRN

## 2015-06-29 MED ORDER — ACETAMINOPHEN 325 MG PO TABS: 650.0000 mg | ORAL_TABLET | Freq: Four times a day (QID) | ORAL | Status: DC | PRN

## 2015-06-29 NOTE — Progress Notes (Signed)
Patient is resting quietly, patient is calm and compliant. He stated that he still felt high levels of depression, anxiety, and hopelessness. He denied SI , HI, and AVH.  Pain 0/1  Patent remains safe with constant observation, support, encouragement, and education offered.  Patient is receptive and compliant, will continue to monitor.

## 2015-06-29 NOTE — BH Assessment (Addendum)
Tele Assessment Note   Edward FettersJeremy C Mayer is an 30 y.o. male Presenting to [facility] reporting suicidal ideation due to losing his daughter. Pt stated "I have suicidal thoughts because I lost my daughter and it all built up". Pt reported that earlier today he had thoughts of cutting his wrist; however he did not act upon them. Pt shared that he has attempted in the past and has had multiple psychiatric hospitalizations. Pt is reporting multiple depressive symptoms and shared that losing his daughter is a stressor. Pt denies HI and AVH at this time.  Pt reported alcohol, cocaine, heroin, marijuana and meth use.   Diagnosis: Substance induced mood disorder   Past Medical History:  Past Medical History  Diagnosis Date  . Prolonged QT interval   . Depression   . Anxiety     History reviewed. No pertinent past surgical history.  Family History: No family history on file.  Social History:  reports that he has been smoking Cigarettes.  He has been smoking about 1.00 pack per day. He has never used smokeless tobacco. He reports that he drinks alcohol. He reports that he uses illicit drugs (Cocaine, Marijuana, and Methamphetamines).  Additional Social History:  Alcohol / Drug Use History of alcohol / drug use?: Yes Substance #1 Name of Substance 1: Alcohol  1 - Age of First Use: 15 1 - Amount (size/oz): 2-40 oz 1 - Frequency: "2x weekly"  1 - Duration: ongoing  1 - Last Use / Amount: 06-29-15 Substance #2 Name of Substance 2: Cocaine  2 - Age of First Use: 16 2 - Amount (size/oz): $30-40 2 - Frequency: "every other day"  2 - Duration: ongoing  2 - Last Use / Amount: 06-28-15 Substance #3 Name of Substance 3: Heroin  3 - Age of First Use: 18 3 - Amount (size/oz): $30-40 3 - Frequency: daily  3 - Duration: ongoing  3 - Last Use / Amount: 1 month ago  Substance #4 Name of Substance 4: THC  4 - Age of First Use: 14 4 - Amount (size/oz): $10-20 4 - Frequency: daily  4 - Duration: ongoing  4  - Last Use / Amount: 06-26-15 Substance #5 Name of Substance 5: Meth  5 - Age of First Use: 20 5 - Amount (size/oz): "a lot"  5 - Frequency: "3-4 times"  5 - Duration: ongoing  5 - Last Use / Amount: 06-26-15  CIWA: CIWA-Ar BP: 122/60 mmHg Pulse Rate: 76 COWS:    PATIENT STRENGTHS: (choose at least two) Average or above average intelligence Motivation for treatment/growth  Allergies: No Known Allergies  Home Medications:  (Not in a hospital admission)  OB/GYN Status:  No LMP for male patient.  General Assessment Data [facility] Assessment: In system Is this a Tele or Face-to-Face Assessment?: Face-to-Face Is this an Initial Assessment or a Re-assessment for this encounter?: Initial Assessment Marital status: Single Living Arrangements: Parent Can pt return to current living arrangement?: Yes Admission Status: Voluntary Is patient capable of signing voluntary admission?: Yes Referral Source: Self/Family/Friend Insurance type: None      Crisis Care Plan Living Arrangements: Parent Name of Psychiatrist: None  Name of Therapist: None   Education Status Is patient currently in school?: No  Risk to self with the past 6 months Suicidal Ideation: Yes-Currently Present Has patient been a risk to self within the past 6 months prior to admission? : Yes Suicidal Intent: Yes-Currently Present Has patient had any suicidal intent within  the past 6 months prior to admission? : Yes Is patient at risk for suicide?: Yes Suicidal Plan?: Yes-Currently Present Has patient had any suicidal plan within the past 6 months prior to admission? : Yes Specify Current Suicidal Plan: Cutting my wrist  Access to Means: Yes Specify Access to Suicidal Means: sharps  What has been your use of drugs/alcohol within the last 12 months?: Alcohol, cocaine, heroin, THC, and meth reported.  Previous Attempts/Gestures: Yes How many times?: 2 Other Self Harm Risks: pt denies   Triggers for Past Attempts: None known Intentional Self Injurious Behavior: None Family Suicide History: Yes (grandpa ) Recent stressful life event(s): Other (Comment) (loss of daughter ) Persecutory voices/beliefs?: Yes Depression: Yes Depression Symptoms: Despondent, Insomnia, Tearfulness, Fatigue, Isolating, Guilt, Loss of interest in usual pleasures, Feeling worthless/self pity, Feeling angry/irritable Substance abuse history and/or treatment for substance abuse?: Yes Suicide prevention information given to non-admitted patients: Not applicable  Risk to Others within the past 6 months Homicidal Ideation: No Does patient have any lifetime risk of violence toward others beyond the six months prior to admission? : No Thoughts of Harm to Others: No Current Homicidal Intent: No Current Homicidal Plan: No Access to Homicidal Means: No Identified Victim: N/A History of harm to others?: No Assessment of Violence: None Noted Violent Behavior Description: No violent behavior observed.  Does patient have access to weapons?: No Criminal Charges Pending?: Yes Describe Pending Criminal Charges: dwlr not impaired  Does patient have a court date: Yes Court Date: 07/12/15 Is patient on probation?: No  Psychosis Hallucinations: None noted Delusions: None noted  Mental Status Report Appearance/Hygiene: Unremarkable, In scrubs Eye Contact: Poor Motor Activity: Freedom of movement Speech: Logical/coherent Level of Consciousness: Quiet/awake Mood: Pleasant Affect: Appropriate to circumstance Anxiety Level: None Thought Processes: Relevant, Coherent Judgement: Unimpaired Orientation: Appropriate for developmental age Obsessive Compulsive Thoughts/Behaviors: None  Cognitive Functioning Concentration: Decreased Memory: Recent Intact, Remote Intact IQ: Average Insight: Fair Impulse Control: Good Appetite: Good Weight Loss: 0 Weight Gain: 0 Sleep: No Change Total Hours of Sleep:  4 Vegetative Symptoms: None  ADLScreening [facility] Assessment Services) Patient's cognitive ability adequate to safely complete daily activities?: Yes Patient able to express need for assistance with ADLs?: Yes Independently performs ADLs?: Yes (appropriate for developmental age)  Prior Inpatient Therapy Prior Inpatient Therapy: Yes Prior Therapy Dates: 2009-present Prior Therapy Facilty/Provider(s): [facility]. [facility]. [facility] Barre Reason for Treatment: substance   Prior Outpatient Therapy Prior Outpatient Therapy: Yes Prior Therapy Dates: 2017 Prior Therapy Facilty/Provider(s): [facility] Reason for Treatment: [facility]  Does patient have an ACCT team?: No Does patient have Intensive In-House Services?  : No Does patient have [facility] services? : No Does patient have P4[facility] services?: No  ADL Screening (condition at time of admission) Patient's cognitive ability adequate to safely complete daily activities?: Yes Is the patient deaf or have difficulty hearing?: No Does the patient have difficulty seeing, even when wearing glasses/contacts?: No Does the patient have difficulty concentrating, remembering, or making decisions?: No Patient able to express need for assistance with ADLs?: Yes Does the patient have difficulty dressing or bathing?: No Independently performs ADLs?: Yes (appropriate for developmental age) Does the patient have difficulty walking or climbing stairs?: No       Abuse/Neglect Assessment (Assessment to be complete while patient is alone) Physical Abuse: Denies Verbal Abuse: Denies Sexual Abuse: Denies Exploitation of patient/patient's resources: Denies Self-Neglect: Denies     Merchant navy officer (For Healthcare) Does patient have an advance directive?: No Would patient like information  on creating an advanced directive?: No - patient declined information    Additional Information 1:1 In Past 12 Months?: No CIRT Risk: No Elopement Risk: No Does patient have medical  clearance?: Yes     Disposition:  Disposition Initial Assessment Completed for this Encounter: Yes Disposition of Patient: Referred to (OBS )  Sherrise Liberto S 06/29/2015 3:04 AM

## 2015-06-29 NOTE — Discharge Summary (Signed)
  Destine Ryser is a 30 year old male who [facility] depression that sLeane Platttarted about six months ago due to losing custody of his daughter. He attributes this to "domestic issues" between him and his ex wife. Patient denies any previous psychiatric history. Patient states "I drank two forty ounces last night. I let things build up. I was thinking about my daughter. I normally do not drink much and when I do it really affects me. I am no longer having those thoughts. I feel ready to go. I live with my mother. I should have just talked to her. I could have called a hotline but I have no minutes on my phone. So I just called 911 instead. [facility] and get back on neurontin which helped my anxiety. I have not been using heroine lately. Still using cocaine but know I need to stop that. But I think I was only suicidal because of the alcohol that I drank." On admission his urine drug screen was positive for cocaine and marijuana. His alcohol level on admission was 59. Patient does not meet criteria for inpatient admission and also denies a need verbally. He reports [facility] and a bus pass to get to his mother's house. Riki RuskJeremy denied suicidal ideation several times during assessment and appears psychiatrically stable for discharge. Patient left [facility] in no acute distress and was provided with outpatient resources ([facility]).   Patient seen by Fransisca KaufmannLaura Davis.

## 2015-06-29 NOTE — BH Assessment (Signed)
Assessment completed. Consulted Alberteen SamFran Hobson, NP who recommended that observation unit.

## 2015-06-29 NOTE — H&P (Signed)
[place] [facility] Provider Admission PAA/H&P  Patient Identification: Edward Mayer MRN:  [id number] Date of Evaluation:  06/29/2015 Chief Complaint:  Patient states "I got depressed when I was drinking last night."  Principal Diagnosis: Substance induced mood disorder ([place]) Diagnosis:   Patient Active Problem List   Diagnosis Date Noted  . Substance induced mood disorder [facility]) [F19.94] 06/29/2015   History of Present Illness:    Edward Mayer is a 30 year old male who presented to [facility] due to depression that started about six months ago due to losing custody of his daughter. He attributes this to "domestic issues" between him and his ex wife. Patient denies any previous psychiatric history. Patient states "I drank two forty ounces last night. I let things build up. I was thinking about my daughter. I normally do not drink much and when I do it really affects me. I am no longer having those thoughts. I feel ready to go. I live with my mother. I should have just talked to her. I could have called a hotline but I have no minutes on my phone. So I just called 911 instead. I would like to be seen at [facility] and get back on neurontin which helped my anxiety. I have not been using heroine lately. Still using cocaine but know I need to stop that. But I think I was only suicidal because of the alcohol that I drank." On admission his urine drug screen was positive for cocaine and marijuana. His alcohol level on admission was 59. Patient does not meet criteria for inpatient admission and also denies a need verbally. He reports needing follow up information for [facility] and a bus pass to get to his mother's house. Verle denied suicidal ideation several times during assessment and appears psychiatrically stable for discharge.   Associated Signs/Symptoms: Depression Symptoms:  depressed mood, fatigue, feelings of worthlessness/guilt, anxiety, loss of energy/fatigue, (Hypo) Manic Symptoms:   Denies Anxiety Symptoms:  Excessive Worry, Psychotic Symptoms:  Denies PTSD Symptoms: Negative Total Time spent with patient: 45 minutes  Past Psychiatric History: Patient denies  Is the patient at risk to self? No.  Has the patient been a risk to self in the past 6 months? No.  Has the patient been a risk to self within the distant past? Yes.    Is the patient a risk to others? No.  Has the patient been a risk to others in the past 6 months? No.  Has the patient been a risk to others within the distant past? No.   Prior Inpatient Therapy:   Yes Prior Outpatient Therapy:  Yes was receiving treatment at [facility] until a month ago  Alcohol Screening: 1. How often do you have a drink containing alcohol?: Monthly or less 2. How many drinks containing alcohol do you have on a typical day when you are drinking?: 1 or 2 3. How often do you have six or more drinks on one occasion?: Never Preliminary Score: 0 9. Have you or someone else been injured as a result of your drinking?: No 10. Has a relative or friend or a doctor or another health worker been concerned about your drinking or suggested you cut down?: No Alcohol Use Disorder Identification Test Final Score (AUDIT): 1 Substance Abuse History in the last 12 months:  Yes.   Consequences of Substance Abuse: Patient reports that alcohol/cocaine induce depressive symptoms.  Previous Psychotropic Medications: Yes Patient states "Effexor and Wellbutrin made me shake."  Psychological Evaluations: No  Past  Medical History:  Past Medical History  Diagnosis Date  . Prolonged QT interval   . Depression   . Anxiety    History reviewed. No pertinent past surgical history. Family History: History reviewed. No pertinent family history. Family Psychiatric History: Reports substance abuse problems on both sides of his family  Tobacco Screening: Smokes one pack per day  Social History:  History  Alcohol Use  . Yes    Comment: few a week       History  Drug Use  . Yes  . Special: Cocaine, Marijuana, Methamphetamines    Additional Social History:                           Allergies:  No Known Allergies Lab Results:  Results for orders placed or performed during the hospital encounter of 06/29/15 (from the past 48 hour(s))  Comprehensive metabolic panel     Status: Abnormal   Collection Time: 06/29/15 12:39 AM  Result Value Ref Range   Sodium 142 135 - 145 mmol/L   Potassium 3.9 3.5 - 5.1 mmol/L   Chloride 108 101 - 111 mmol/L   CO2 24 22 - 32 mmol/L   Glucose, Bld 76 65 - 99 mg/dL   BUN 19 6 - 20 mg/dL   Creatinine, Ser 0.89 0.61 - 1.24 mg/dL   Calcium 9.4 8.9 - 10.3 mg/dL   Total Protein 7.8 6.5 - 8.1 g/dL   Albumin 4.4 3.5 - 5.0 g/dL   AST 38 15 - 41 U/L   ALT 98 (H) 17 - 63 U/L   Alkaline Phosphatase 69 38 - 126 U/L   Total Bilirubin 0.8 0.3 - 1.2 mg/dL   GFR calc non Af Amer >60 >60 mL/min   GFR calc Af Amer >60 >60 mL/min    Comment: (NOTE) The eGFR has been calculated using the CKD EPI equation. This calculation has not been validated in all clinical situations. eGFR's persistently <60 mL/min signify possible Chronic Kidney Disease.    Anion gap 10 5 - 15  Ethanol     Status: Abnormal   Collection Time: 06/29/15 12:39 AM  Result Value Ref Range   Alcohol, Ethyl (B) 59 (H) <5 mg/dL    Comment:        LOWEST DETECTABLE LIMIT FOR SERUM ALCOHOL IS 5 mg/dL FOR MEDICAL PURPOSES ONLY   Salicylate level     Status: None   Collection Time: 06/29/15 12:39 AM  Result Value Ref Range   Salicylate Lvl <5.1 2.8 - 30.0 mg/dL  Acetaminophen level     Status: Abnormal   Collection Time: 06/29/15 12:39 AM  Result Value Ref Range   Acetaminophen (Tylenol), Serum <10 (L) 10 - 30 ug/mL    Comment:        THERAPEUTIC CONCENTRATIONS VARY SIGNIFICANTLY. A RANGE OF 10-30 ug/mL MAY BE AN EFFECTIVE CONCENTRATION FOR MANY PATIENTS. HOWEVER, SOME ARE BEST TREATED AT CONCENTRATIONS OUTSIDE  THIS RANGE. ACETAMINOPHEN CONCENTRATIONS >150 ug/mL AT 4 HOURS AFTER INGESTION AND >50 ug/mL AT 12 HOURS AFTER INGESTION ARE OFTEN ASSOCIATED WITH TOXIC REACTIONS.   cbc     Status: Abnormal   Collection Time: 06/29/15 12:39 AM  Result Value Ref Range   WBC 15.4 (H) 4.0 - 10.5 K/uL   RBC 5.49 4.22 - 5.81 MIL/uL   Hemoglobin 15.7 13.0 - 17.0 g/dL   HCT 46.2 39.0 - 52.0 %   MCV 84.2 78.0 - 100.0 fL   MCH  28.6 26.0 - 34.0 pg   MCHC 34.0 30.0 - 36.0 g/dL   RDW 13.8 11.5 - 15.5 %   Platelets 301 150 - 400 K/uL  Rapid urine drug screen (hospital performed)     Status: Abnormal   Collection Time: 06/29/15  4:18 AM  Result Value Ref Range   Opiates NONE DETECTED NONE DETECTED   Cocaine POSITIVE (A) NONE DETECTED   Benzodiazepines NONE DETECTED NONE DETECTED   Amphetamines NONE DETECTED NONE DETECTED   Tetrahydrocannabinol POSITIVE (A) NONE DETECTED   Barbiturates NONE DETECTED NONE DETECTED    Comment:        DRUG SCREEN FOR MEDICAL PURPOSES ONLY.  IF CONFIRMATION IS NEEDED FOR ANY PURPOSE, NOTIFY LAB WITHIN 5 DAYS.        LOWEST DETECTABLE LIMITS FOR URINE DRUG SCREEN Drug Class       Cutoff (ng/mL) Amphetamine      1000 Barbiturate      200 Benzodiazepine   818 Tricyclics       563 Opiates          300 Cocaine          300 THC              50   Urinalysis, Routine w reflex microscopic (not at [facility])     Status: Abnormal   Collection Time: 06/29/15  4:18 AM  Result Value Ref Range   Color, Urine YELLOW YELLOW   APPearance CLOUDY (A) CLEAR   Specific Gravity, Urine 1.023 1.005 - 1.030   pH 6.5 5.0 - 8.0   Glucose, UA NEGATIVE NEGATIVE mg/dL   Hgb urine dipstick NEGATIVE NEGATIVE   Bilirubin Urine NEGATIVE NEGATIVE   Ketones, ur NEGATIVE NEGATIVE mg/dL   Protein, ur NEGATIVE NEGATIVE mg/dL   Nitrite NEGATIVE NEGATIVE   Leukocytes, UA NEGATIVE NEGATIVE    Comment: MICROSCOPIC NOT DONE ON URINES WITH NEGATIVE PROTEIN, BLOOD, LEUKOCYTES, NITRITE, OR GLUCOSE <1000  mg/dL.    Blood Alcohol level:  Lab Results  Component Value Date   ETH 59* 14/97/0263    Metabolic Disorder Labs:  No results found for: HGBA1C, MPG No results found for: PROLACTIN No results found for: CHOL, TRIG, HDL, CHOLHDL, VLDL, LDLCALC  Current Medications: Current Facility-Administered Medications  Medication Dose Route Frequency Provider Last Rate Last Dose  . acetaminophen (TYLENOL) tablet 650 mg  650 mg Oral Q6H PRN Lurena Nida, NP       PTA Medications: Prescriptions prior to admission  Medication Sig Dispense Refill Last Dose  . promethazine (PHENERGAN) 12.5 MG tablet Take 1 tablet (12.5 mg total) by mouth every 8 (eight) hours as needed for nausea or vomiting. (Patient not taking: Reported on 06/29/2015) 10 tablet 0 Completed Course at Unknown time    Musculoskeletal: Strength & Muscle Tone: within normal limits Gait & Station: normal Patient leans: N/A  Psychiatric Specialty Exam: Physical Exam  Review of Systems  Psychiatric/Behavioral: Positive for depression (Stable ) and substance abuse. Negative for suicidal ideas, hallucinations and memory loss. The patient is not nervous/anxious and does not have insomnia.     Blood pressure 102/68, pulse 77, temperature 98.3 F (36.8 C), temperature source Oral, resp. rate 18, height 5' 5"  (1.651 m), weight 68.04 kg (150 lb), SpO2 99 %.Body mass index is 24.96 kg/(m^2).  General Appearance: Casual  Eye Contact:  Good  Speech:  Clear and Coherent  Volume:  Normal  Mood:  Euthymic  Affect:  Appropriate  Thought Process:  Coherent and  Goal Directed  Orientation:  Full (Time, Place, and Person)  Thought Content:  WDL  Suicidal Thoughts:  No  Homicidal Thoughts:  No  Memory:  Immediate;   Good Recent;   Good Remote;   Good  Judgement:  Impaired  Insight:  Shallow  Psychomotor Activity:  Normal  Concentration:  Concentration: Good and Attention Span: Good  Recall:  Good  Fund of Knowledge:  Good  Language:   Good  Akathisia:  No  Handed:  Right  AIMS (if indicated):     Assets:  Communication Skills Desire for Improvement Housing Leisure Time Physical Health Resilience  ADL's:  Intact  Cognition:  WNL  Sleep:         Treatment Plan Summary: Plan Currently denies SI, motivated to follow up with [facility] after discharge   Observation Level/Precautions:  Continuous Observation Laboratory:  CBC Chemistry Profile UDS Psychotherapy:  Individual  Medications:  None Consultations:  None Discharge Concerns:  Continued substance abuse Estimated LOS: 24-48 hours Other:  Provide patient with outpatient resources at [facility] following discharge     Elmarie Shiley, NP 6/17/20172:44 PM I agreed with findings and treatment plan of this patient

## 2015-06-29 NOTE — [facility] Counselor (Signed)
Spoke with pt. During lunch, and this afternoon. Pt was seen by Vernona RiegerLaura, NP. Pt to be d/c today and request outpatient resources. Per [facility] pt. [facility].   Zackry Deines K. Sherlon HandingHarris, LCAS-A, LPC-A, [facility]  Counselor 06/29/2015 2:26 PM

## 2015-06-29 NOTE — Progress Notes (Addendum)
[facility]. On admission, patient appear tired and sleepy. Patient stated "I'm here because of depression and anxiety. I have been depressed for 6 months since I lost my daughter to CPS. I can't get over it. I was feeling too bad yesterday that I called a friend who called the police and [facility]". Patient denied pain, AH/VH at this time. Denies SI stated "I feel safe here". Patient verbally contracted for safety. Patient was calm and cooperative during the admission process.  A: Skin was assessed, no contraband found. Tattoos noted on both hands, right leg, upper back. No bruises/wounds noted. POC and unit policies explained and understanding verbalized. Consents obtained. Refused food and fluids offered, stated "I will wait till breakfast. Just want to lie down.   R: Patient had no additional questions or concerns.

## 2015-06-29 NOTE — ED Provider Notes (Signed)
CSN: [id number]     Arrival date & time 06/28/15  2340 History   First MD Initiated Contact with Patient 06/29/15 0134     Chief Complaint  Patient presents with  . Suicidal     (Consider location/radiation/quality/duration/timing/severity/associated sxs/prior Treatment) HPI   Edward Mayer is a 30 y.o M with a pmhx of Depression and anxiety who presents to the ED today complaining of suicidal ideation. Patient states that he is having thoughts of of harming himself for the last 6 months but in the last 2 weeks he states he felt that it was more serious. He states that he has now stated that he made do something to actually harm himself. He denies having any active plan. He reports alcohol, cocaine and heroin use. Last use was yesterday. He denies any HI, hallucinations.  Past Medical History  Diagnosis Date  . Prolonged QT interval   . Depression   . Anxiety    History reviewed. No pertinent past surgical history. No family history on file. Social History  Substance Use Topics  . Smoking status: Current Every Day Smoker -- 1.00 packs/day    Types: Cigarettes  . Smokeless tobacco: Never Used  . Alcohol Use: Yes     Comment: few a week     Review of Systems  All other systems reviewed and are negative.     Allergies  Review of patient's allergies indicates no known allergies.  Home Medications   Prior to Admission medications   Medication Sig Start Date End Date Taking? Authorizing Provider  promethazine (PHENERGAN) 12.5 MG tablet Take 1 tablet (12.5 mg total) by mouth every 8 (eight) hours as needed for nausea or vomiting. Patient not taking: Reported on 06/29/2015 04/03/15   Audry Pili, PA-C   BP 122/60 mmHg  Pulse 76  Temp(Src) 98.5 F (36.9 C) (Oral)  Resp 15  Ht  (1.651 m)  Wt 68.04 kg  BMI 24.96 kg/m2  SpO2 96% Physical Exam  Constitutional: He is oriented to person, place, and time. He appears well-developed and well-nourished. No distress.  HENT:   Head: Normocephalic and atraumatic.  Mouth/Throat: No oropharyngeal exudate.  Eyes: Conjunctivae and EOM are normal. Pupils are equal, round, and reactive to light. Right eye exhibits no discharge. Left eye exhibits no discharge. No scleral icterus.  Cardiovascular: Normal rate, regular rhythm, normal heart sounds and intact distal pulses.  Exam reveals no gallop and no friction rub.   No murmur heard. Pulmonary/Chest: Effort normal and breath sounds normal. No respiratory distress. He has no wheezes. He has no rales. He exhibits no tenderness.  Abdominal: Soft. He exhibits no distension. There is no tenderness. There is no guarding.  Musculoskeletal: Normal range of motion. He exhibits no edema.  Neurological: He is alert and oriented to person, place, and time.  Skin: Skin is warm and dry. No rash noted. He is not diaphoretic. No erythema. No pallor.  Psychiatric: He has a normal mood and affect. His speech is normal and behavior is normal. Judgment normal. Cognition and memory are normal. He expresses suicidal ideation. He expresses no homicidal ideation.  Nursing note and vitals reviewed.   ED Course  Procedures (including critical care time) Labs Review Labs Reviewed  COMPREHENSIVE METABOLIC PANEL - Abnormal; Notable for the following:    ALT 98 (*)    All other components within normal limits  ETHANOL - Abnormal; Notable for the following:    Alcohol, Ethyl (B) 59 (*)    All  other components within normal limits  ACETAMINOPHEN LEVEL - Abnormal; Notable for the following:    Acetaminophen (Tylenol), Serum <10 (*)    All other components within normal limits  CBC - Abnormal; Notable for the following:    WBC 15.4 (*)    All other components within normal limits  SALICYLATE LEVEL  URINE RAPID DRUG SCREEN, HOSP PERFORMED  URINALYSIS, ROUTINE W REFLEX MICROSCOPIC (NOT AT [facility])    Imaging Review No results found. I have personally reviewed and evaluated these images and lab  results as part of my medical decision-making.   EKG Interpretation None      MDM   Final diagnoses:  Suicidal ideation    Patient presents with complaints of suicidal ideation. No HI or hallucinations. Last use of cocaine and heroin was yesterday. He denies any other symptoms. Mild leukocytosis present, this is nonspecific. No symptoms suggestive of active infection. Feel that he is medically cleared. Patient was placed in psych hold and will await TTS consult.     Lester KinsmanSamantha Tripp [place], PA-C 06/29/15 0145  Rolland PorterMark James, MD 07/06/15 1351

## 2015-06-29 NOTE — Discharge Instructions (Signed)
You are encouraged to follow [facility] for medication management and continuation of care. Also, you are encouraged to follow up with [facility] for follow up and outpatient substance abuse treatment.

## 2015-06-29 NOTE — ED Notes (Signed)
Pt wanded by security. Sitter at bedside.  Pt given sandwich and glass of water.

## 2015-06-29 NOTE — Progress Notes (Signed)
[facility] INPATIENT:  Family/Significant Other Suicide Prevention Education  Suicide Prevention Education:  Patient Refusal for Family/Significant Other Suicide Prevention Education: The patient Edward FettersJeremy C Mayer has refused to provide written consent for family/significant other to be provided Family/Significant Other Suicide Prevention Education during admission and/or prior to discharge.  Physician notified.  Edward Mayer, Edward Mayer B 06/29/2015, 6:40 AM

## 2015-06-29 NOTE — BH Assessment (Signed)
PT has been accepted to the observation unit Bed 2 (Dr. Lucianne MussKumar). Informed Dr. Judd Lienelo of the recommendation.

## 2015-06-29 NOTE — ED Notes (Signed)
Pt stated he began feeling depressed about 6 months ago when he lost his daughter to CPS due to "domestic issues" between him and his ex wife. States he is having SI with plan but denies intent right now to slit his wrist or jump off of a bridge.

## 2021-02-05 ENCOUNTER — Encounter (HOSPITAL_COMMUNITY): Payer: Self-pay

## 2021-02-05 ENCOUNTER — Other Ambulatory Visit: Payer: Self-pay

## 2021-02-05 ENCOUNTER — Inpatient Hospital Stay (HOSPITAL_COMMUNITY)
Admission: EM | Admit: 2021-02-05 | Discharge: 2021-02-08 | DRG: 380 | Disposition: A | Discharge status: Home / Self Care | Payer: 59 | Attending: Internal Medicine | Admitting: Internal Medicine

## 2021-02-05 DIAGNOSIS — K209 Esophagitis, unspecified without bleeding: Secondary | ICD-10-CM | POA: Diagnosis present

## 2021-02-05 DIAGNOSIS — K2970 Gastritis, unspecified, without bleeding: Secondary | ICD-10-CM

## 2021-02-05 DIAGNOSIS — R112 Nausea with vomiting, unspecified: Secondary | ICD-10-CM | POA: Diagnosis not present

## 2021-02-05 DIAGNOSIS — R1310 Dysphagia, unspecified: Secondary | ICD-10-CM

## 2021-02-05 DIAGNOSIS — R011 Cardiac murmur, unspecified: Secondary | ICD-10-CM | POA: Diagnosis present

## 2021-02-05 DIAGNOSIS — D500 Iron deficiency anemia secondary to blood loss (chronic): Secondary | ICD-10-CM

## 2021-02-05 DIAGNOSIS — F1210 Cannabis abuse, uncomplicated: Secondary | ICD-10-CM | POA: Diagnosis present

## 2021-02-05 DIAGNOSIS — K2210 Ulcer of esophagus without bleeding: Principal | ICD-10-CM | POA: Diagnosis present

## 2021-02-05 DIAGNOSIS — I4901 Ventricular fibrillation: Secondary | ICD-10-CM | POA: Diagnosis present

## 2021-02-05 DIAGNOSIS — I4581 Long QT syndrome: Secondary | ICD-10-CM

## 2021-02-05 DIAGNOSIS — I5022 Chronic systolic (congestive) heart failure: Secondary | ICD-10-CM

## 2021-02-05 DIAGNOSIS — F419 Anxiety disorder, unspecified: Secondary | ICD-10-CM | POA: Diagnosis present

## 2021-02-05 DIAGNOSIS — Z8674 Personal history of sudden cardiac arrest: Secondary | ICD-10-CM

## 2021-02-05 DIAGNOSIS — T471X6A Underdosing of other antacids and anti-gastric-secretion drugs, initial encounter: Secondary | ICD-10-CM | POA: Diagnosis present

## 2021-02-05 DIAGNOSIS — F1910 Other psychoactive substance abuse, uncomplicated: Secondary | ICD-10-CM | POA: Insufficient documentation

## 2021-02-05 DIAGNOSIS — Z20822 Contact with and (suspected) exposure to covid-19: Secondary | ICD-10-CM | POA: Diagnosis present

## 2021-02-05 DIAGNOSIS — Z91120 Patient's intentional underdosing of medication regimen due to financial hardship: Secondary | ICD-10-CM

## 2021-02-05 DIAGNOSIS — Z8719 Personal history of other diseases of the digestive system: Secondary | ICD-10-CM

## 2021-02-05 DIAGNOSIS — F17210 Nicotine dependence, cigarettes, uncomplicated: Secondary | ICD-10-CM | POA: Diagnosis present

## 2021-02-05 DIAGNOSIS — Z79899 Other long term (current) drug therapy: Secondary | ICD-10-CM

## 2021-02-05 DIAGNOSIS — F32A Depression, unspecified: Secondary | ICD-10-CM | POA: Diagnosis present

## 2021-02-05 LAB — CBC
HCT: 34.8 % — ABNORMAL LOW (ref 39.0–52.0)
Hemoglobin: 10.6 g/dL — ABNORMAL LOW (ref 13.0–17.0)
MCH: 24.5 pg — ABNORMAL LOW (ref 26.0–34.0)
MCHC: 30.5 g/dL (ref 30.0–36.0)
MCV: 80.6 fL (ref 80.0–100.0)
Platelets: 453 10*3/uL — ABNORMAL HIGH (ref 150–400)
RBC: 4.32 MIL/uL (ref 4.22–5.81)
RDW: 16.3 % — ABNORMAL HIGH (ref 11.5–15.5)
WBC: 5.8 10*3/uL (ref 4.0–10.5)
nRBC: 0 % (ref 0.0–0.2)

## 2021-02-05 LAB — BASIC METABOLIC PANEL
Anion gap: 6 (ref 5–15)
BUN: 16 mg/dL (ref 6–20)
CO2: 26 mmol/L (ref 22–32)
Calcium: 9.3 mg/dL (ref 8.9–10.3)
Chloride: 107 mmol/L (ref 98–111)
Creatinine, Ser: 0.72 mg/dL (ref 0.61–1.24)
GFR, Estimated: >60 mL/min (ref >60)
Glucose, Bld: 101 mg/dL — ABNORMAL HIGH (ref 70–99)
Potassium: 4.4 mmol/L (ref 3.5–5.1)
Sodium: 139 mmol/L (ref 135–145)

## 2021-02-05 LAB — RAPID URINE DRUG SCREEN, HOSP PERFORMED
Amphetamines: NOT DETECTED
Barbiturates: NOT DETECTED
Benzodiazepines: NOT DETECTED
Cocaine: NOT DETECTED
Opiates: NOT DETECTED
Tetrahydrocannabinol: POSITIVE — AB

## 2021-02-05 LAB — TYPE AND SCREEN
ABO/RH(D): A POS
Antibody Screen: NEGATIVE

## 2021-02-05 LAB — ETHANOL: Alcohol, Ethyl (B): <10 mg/dL (ref <10)

## 2021-02-05 LAB — PROTIME-INR
INR: 0.9 (ref 0.8–1.2)
Prothrombin Time: 12.2 seconds (ref 11.4–15.2)

## 2021-02-05 LAB — LIPASE, BLOOD: Lipase: 63 U/L — ABNORMAL HIGH (ref 11–51)

## 2021-02-05 MED ORDER — PANTOPRAZOLE SODIUM 40 MG IV SOLR
40.0000 mg | Freq: Once | INTRAVENOUS | Status: AC
Start: 2021-02-05 — End: 2021-02-05
  Administered 2021-02-05: 23:00:00 40 mg via INTRAVENOUS
  Filled 2021-02-05: qty 40

## 2021-02-05 MED ORDER — ONDANSETRON HCL 4 MG/2ML IJ SOLN: 4.0000 mg | Freq: Once | INTRAMUSCULAR | Status: DC

## 2021-02-05 MED ORDER — ALUM & MAG HYDROXIDE-SIMETH 200-200-20 MG/5ML PO SUSP
30.0000 mL | Freq: Once | ORAL | Status: AC
Start: 2021-02-05 — End: 2021-02-05
  Administered 2021-02-05: 23:00:00 30 mL via ORAL
  Filled 2021-02-05: qty 30

## 2021-02-05 MED ORDER — LIDOCAINE VISCOUS HCL 2 % MT SOLN
15.0000 mL | Freq: Once | OROMUCOSAL | Status: AC
  Administered 2021-02-05: 23:00:00 15 mL via ORAL
  Filled 2021-02-05: qty 15

## 2021-02-05 NOTE — ED Notes (Signed)
CBC re-collected and sent

## 2021-02-05 NOTE — ED Notes (Signed)
Patient vomitted medication soon after swallowing.

## 2021-02-05 NOTE — ED Provider Notes (Signed)
[facility]-EMERGENCY DEPT Provider Note   CSN: [id number] Arrival date & time: 02/05/21  1857     History  Chief Complaint  Patient presents with   Esophagitis    Edward Mayer is a 36 y.o. male.  Presented to the emergency room with concern for epigastric abdominal discomfort, vomiting.  Patient states every time he tries eating or drinking something it immediately comes back up.  Causes significant discomfort.  Currently has no symptoms at rest while not attempting to eat or drink anything.  prolonged QT syndrome, upper GI bleed, and CHF, torsades cardiac arrest hx  HPI     Home Medications Prior to Admission medications   Medication Sig Start Date End Date Taking? Authorizing Provider  promethazine (PHENERGAN) 12.5 MG tablet Take 1 tablet (12.5 mg total) by mouth every 8 (eight) hours as needed for nausea or vomiting. Patient not taking: Reported on 06/29/2015 04/03/15   Audry Pili, PA-C      Allergies    Patient has no known allergies.    Review of Systems   Review of Systems  Gastrointestinal:  Positive for nausea and vomiting.  All other systems reviewed and are negative.  Physical Exam Updated Vital Signs BP 120/79    Pulse 69    Temp 98.4 F (36.9 C) (Oral)    Resp (!) 22    SpO2 99%  Physical Exam Vitals and nursing note reviewed.  Constitutional:      General: He is not in acute distress.    Appearance: He is well-developed.  HENT:     Head: Normocephalic and atraumatic.  Eyes:     Conjunctiva/sclera: Conjunctivae normal.  Cardiovascular:     Rate and Rhythm: Normal rate and regular rhythm.     Heart sounds: No murmur heard. Pulmonary:     Effort: Pulmonary effort is normal. No respiratory distress.     Breath sounds: Normal breath sounds.  Abdominal:     Palpations: Abdomen is soft.     Tenderness: There is no abdominal tenderness.  Musculoskeletal:        General: No swelling.     Cervical back: Neck supple.  Skin:     General: Skin is warm and dry.     Capillary Refill: Capillary refill takes less than 2 seconds.  Neurological:     General: No focal deficit present.     Mental Status: He is alert and oriented to person, place, and time.  Psychiatric:        Mood and Affect: Mood normal.    ED Results / Procedures / Treatments   Labs (all labs ordered are listed, but only abnormal results are displayed) Labs Reviewed  BASIC METABOLIC PANEL - Abnormal; Notable for the following components:      Result Value   Glucose, Bld 101 (*)    All other components within normal limits  LIPASE, BLOOD - Abnormal; Notable for the following components:   Lipase 63 (*)    All other components within normal limits  RAPID URINE DRUG SCREEN, HOSP PERFORMED - Abnormal; Notable for the following components:   Tetrahydrocannabinol POSITIVE (*)    All other components within normal limits  CBC - Abnormal; Notable for the following components:   Hemoglobin 10.6 (*)    HCT 34.8 (*)    MCH 24.5 (*)    RDW 16.3 (*)    Platelets 453 (*)    All other components within normal limits  PROTIME-INR  ETHANOL  TYPE  AND SCREEN    EKG EKG Interpretation  Date/Time:  Wednesday February 05 2021 21:32:23 EST Ventricular Rate:  70 PR Interval:  143 QRS Duration: 133 QT Interval:  430 QTC Calculation: 464 R Axis:   6 Text Interpretation: Sinus rhythm Nonspecific intraventricular conduction delay ST elev, probable normal early repol pattern Confirmed by Marianna Fuss ([id number]) on 02/05/2021 9:45:18 PM  Radiology No results found.  Procedures Procedures    Medications Ordered in ED Medications  pantoprazole (PROTONIX) injection 40 mg (40 mg Intravenous Given 02/05/21 2245)  alum & mag hydroxide-simeth (MAALOX/MYLANTA) 200-200-20 MG/5ML suspension 30 mL (30 mLs Oral Given 02/05/21 2240)    And  lidocaine (XYLOCAINE) 2 % viscous mouth solution 15 mL (15 mLs Oral Given 02/05/21 2240)    ED Course/ Medical Decision  Making/ A&P                           Medical Decision Making Amount and/or Complexity of Data Reviewed Labs: ordered.  Risk OTC drugs. Prescription drug management. Decision regarding hospitalization.   36 year old gentleman presenting to ER with concern for vomiting.  Patient reported history of severe esophagitis.  Completed extensive chart review, review of recent discharge summaries from [facility].  Has had prior admissions over the last few months for severe esophagitis/gastritis.  Also during 1 of these admissions had torsades, cardiac arrest.  Today, suspect most likely issues with his underlying esophagitis/gastritis.  Has been off PPI.  Basic labs today stable.  No renal dysfunction or electrolyte derangement.  No significant anemia.  Gave dose of IV Protonix, attempted to provide GI cocktail however patient near immediately vomited this.  Discussed case with Dr. Sandrea Hammond, agrees with continuing PPI for now, no further recommendations tonight.  Could consider barium swallow study if still reporting swallowing difficulties tomorrow.  Given patient is unable to tolerate p.o. at present, feel he would benefit from admission for further management and observation.  Discussed with Dr. Cyndia Bent with hospitalist service who will admit patient.        Final Clinical Impression(s) / ED Diagnoses Final diagnoses:  Esophagitis  Gastritis without bleeding, unspecified chronicity, unspecified gastritis type    Rx / DC Orders ED Discharge Orders     None         Milagros Loll, MD 02/06/21 0010

## 2021-02-05 NOTE — ED Triage Notes (Signed)
Pt c/o esophageal bleeding and pain. Pt states he had a procedure done a month ago where a hole in his esophagus was stitched closed. Pt states he has not been able to eat or drink for 2 days due to swelling in his esophagus.

## 2021-02-06 ENCOUNTER — Observation Stay (HOSPITAL_COMMUNITY): Payer: 59

## 2021-02-06 DIAGNOSIS — Z8674 Personal history of sudden cardiac arrest: Secondary | ICD-10-CM | POA: Diagnosis not present

## 2021-02-06 DIAGNOSIS — F1910 Other psychoactive substance abuse, uncomplicated: Secondary | ICD-10-CM | POA: Insufficient documentation

## 2021-02-06 DIAGNOSIS — Z8719 Personal history of other diseases of the digestive system: Secondary | ICD-10-CM | POA: Diagnosis not present

## 2021-02-06 DIAGNOSIS — D500 Iron deficiency anemia secondary to blood loss (chronic): Secondary | ICD-10-CM | POA: Diagnosis present

## 2021-02-06 DIAGNOSIS — I5022 Chronic systolic (congestive) heart failure: Secondary | ICD-10-CM

## 2021-02-06 DIAGNOSIS — R1319 Other dysphagia: Secondary | ICD-10-CM | POA: Diagnosis not present

## 2021-02-06 DIAGNOSIS — R011 Cardiac murmur, unspecified: Secondary | ICD-10-CM | POA: Diagnosis present

## 2021-02-06 DIAGNOSIS — F32A Depression, unspecified: Secondary | ICD-10-CM | POA: Diagnosis present

## 2021-02-06 DIAGNOSIS — Z20822 Contact with and (suspected) exposure to covid-19: Secondary | ICD-10-CM | POA: Diagnosis present

## 2021-02-06 DIAGNOSIS — T471X6A Underdosing of other antacids and anti-gastric-secretion drugs, initial encounter: Secondary | ICD-10-CM | POA: Diagnosis present

## 2021-02-06 DIAGNOSIS — I4581 Long QT syndrome: Secondary | ICD-10-CM | POA: Diagnosis not present

## 2021-02-06 DIAGNOSIS — Z79899 Other long term (current) drug therapy: Secondary | ICD-10-CM | POA: Diagnosis not present

## 2021-02-06 DIAGNOSIS — K2210 Ulcer of esophagus without bleeding: Secondary | ICD-10-CM | POA: Diagnosis present

## 2021-02-06 DIAGNOSIS — Z91120 Patient's intentional underdosing of medication regimen due to financial hardship: Secondary | ICD-10-CM | POA: Diagnosis not present

## 2021-02-06 DIAGNOSIS — I4901 Ventricular fibrillation: Secondary | ICD-10-CM | POA: Diagnosis present

## 2021-02-06 DIAGNOSIS — F419 Anxiety disorder, unspecified: Secondary | ICD-10-CM | POA: Diagnosis present

## 2021-02-06 DIAGNOSIS — K222 Esophageal obstruction: Secondary | ICD-10-CM | POA: Diagnosis not present

## 2021-02-06 DIAGNOSIS — R1310 Dysphagia, unspecified: Secondary | ICD-10-CM | POA: Diagnosis not present

## 2021-02-06 DIAGNOSIS — K209 Esophagitis, unspecified without bleeding: Secondary | ICD-10-CM | POA: Diagnosis not present

## 2021-02-06 DIAGNOSIS — F17210 Nicotine dependence, cigarettes, uncomplicated: Secondary | ICD-10-CM | POA: Diagnosis present

## 2021-02-06 DIAGNOSIS — F1210 Cannabis abuse, uncomplicated: Secondary | ICD-10-CM | POA: Diagnosis present

## 2021-02-06 DIAGNOSIS — R112 Nausea with vomiting, unspecified: Secondary | ICD-10-CM | POA: Diagnosis present

## 2021-02-06 LAB — IRON AND TIBC
Iron: 20 ug/dL — ABNORMAL LOW (ref 45–182)
Saturation Ratios: 4 % — ABNORMAL LOW (ref 17.9–39.5)
TIBC: 454 ug/dL — ABNORMAL HIGH (ref 250–450)
UIBC: 434 ug/dL

## 2021-02-06 LAB — RESP PANEL BY RT-PCR (FLU A&B, COVID) ARPGX2
Influenza A by PCR: NEGATIVE
Influenza B by PCR: NEGATIVE
SARS Coronavirus 2 by RT PCR: NEGATIVE

## 2021-02-06 LAB — CBC
HCT: 34.5 % — ABNORMAL LOW (ref 39.0–52.0)
Hemoglobin: 10.3 g/dL — ABNORMAL LOW (ref 13.0–17.0)
MCH: 24.1 pg — ABNORMAL LOW (ref 26.0–34.0)
MCHC: 29.9 g/dL — ABNORMAL LOW (ref 30.0–36.0)
MCV: 80.6 fL (ref 80.0–100.0)
Platelets: 428 10*3/uL — ABNORMAL HIGH (ref 150–400)
RBC: 4.28 MIL/uL (ref 4.22–5.81)
RDW: 16.4 % — ABNORMAL HIGH (ref 11.5–15.5)
WBC: 5.3 10*3/uL (ref 4.0–10.5)
nRBC: 0 % (ref 0.0–0.2)

## 2021-02-06 LAB — HIV ANTIBODY (ROUTINE TESTING W REFLEX): HIV Screen 4th Generation wRfx: NONREACTIVE

## 2021-02-06 LAB — ABO/RH: ABO/RH(D): A POS

## 2021-02-06 MED ORDER — SODIUM CHLORIDE 0.9 % IV SOLN: INTRAVENOUS | Status: DC

## 2021-02-06 MED ORDER — IOHEXOL 350 MG/ML SOLN
100.0000 mL | Freq: Once | INTRAVENOUS | Status: AC | PRN
  Administered 2021-02-06: 50 mL via ORAL

## 2021-02-06 MED ORDER — PANTOPRAZOLE SODIUM 40 MG IV SOLR
40.0000 mg | Freq: Two times a day (BID) | INTRAVENOUS | Status: DC
  Administered 2021-02-06 – 2021-02-08 (×4): 40 mg via INTRAVENOUS
  Filled 2021-02-06 (×4): qty 40

## 2021-02-06 MED ORDER — PANTOPRAZOLE SODIUM 40 MG IV SOLR: 40.0000 mg | Freq: Every day | INTRAVENOUS | Status: DC

## 2021-02-06 NOTE — Assessment & Plan Note (Signed)
-   Start oral iron at [place]

## 2021-02-06 NOTE — H&P (View-Only) (Signed)
Referring Provider: [facility] °Primary Care Physician:  System, Provider Not In °Primary Gastroenterologist:  Unassigned ([facility]) ° °Reason for Consultation:  Dysphagia ° °HPI: Edward Mayer is a 36 y.o. male medical history significant for polysubstance abuse, history of VF arrest, chronic systolic heart failure presents for evaluation of dysphagia. ° °Patient has chronic dysphagia with numerous hospitalizations for same. Oct 2022 at [facility] patient presented with melena/hematemesis. Hospital course was complicated by torsades v.fib arrest. This was thought to be from prolonged QT. ° °EGD 10/24/20 inpatient: esophagitis with esophageal ulcer. Patient was placed on PPI. He was noncompliant as an outpatient due to cost of medication. ° °DG esophagus w single contrast 1/26: persistent narrowing of distal esophagus with resulting hold up, but no obstruction to contrast passage. Possible esophagitis and/or stricture. Severe lower esophageal wall thickening on CT December 2022. ° °Patient states 3 days ago his dysphagia acutely worsened. He states any time he eats or drinks anything it gets stuck in his chest. Sometimes results in him having to vomit it back up. Denies hematemesis. Patient states he saw dark stools a few days ago but those have resolved. Denies weight loss. Denies family history of GI issues or colon cancer. Patient is currently undergoing a detox program. Last used fentanyl, meth, marijuana a few weeks ago. Denies alcohol use. Reports 400 mg ibuprofen use a few times per week for headaches. ° ° °Past Medical History:  °Diagnosis Date  ° Anxiety   ° Depression   ° Prolonged QT interval   ° ° °History reviewed. No pertinent surgical history. ° °Prior to Admission medications   °Medication Sig Start Date End Date Taking? Authorizing Provider  °Melatonin 10 MG TABS Take 20 mg by mouth at bedtime.   Yes [provider]  °pantoprazole (PROTONIX) 40 MG tablet Take 40 mg by mouth daily before  breakfast.   Yes [provider]  °promethazine (PHENERGAN) 12.5 MG tablet Take 1 tablet (12.5 mg total) by mouth every 8 (eight) hours as needed for nausea or vomiting. °Patient not taking: Reported on 02/06/2021 04/03/15   Mohr, Tyler, PA-C  ° ° °Scheduled Meds: ° [START ON 02/07/2021] pantoprazole (PROTONIX) IV  40 mg Intravenous QHS  ° °Continuous Infusions: ° sodium chloride 75 mL/hr at 02/06/21 0120  ° °PRN Meds:. ° °Allergies as of 02/05/2021  ° (No Known Allergies)  ° ° °History reviewed. No pertinent family history. ° °Social History  ° °Socioeconomic History  ° Marital status: Single  °  Spouse name: Not on file  ° Number of children: Not on file  ° Years of education: Not on file  ° Highest education level: Not on file  °Occupational History  ° Not on file  °Tobacco Use  ° Smoking status: Every Day  °  Packs/day: 1.00  °  Types: Cigarettes  ° Smokeless tobacco: Never  °Substance and Sexual Activity  ° Alcohol use: Yes  °  Comment: few a week   ° Drug use: Yes  °  Types: Cocaine, Marijuana, Methamphetamines  ° Sexual activity: Never  °Other Topics Concern  ° Not on file  °Social History Narrative  ° Not on file  ° °Social Determinants of Health  ° °Financial Resource Strain: Not on file  °Food Insecurity: Not on file  °Transportation Needs: Not on file  °Physical Activity: Not on file  °Stress: Not on file  °Social Connections: Not on file  °Intimate Partner Violence: Not on file  ° ° °Review of Systems: Review   of Systems  Constitutional:  Negative for chills and fever.  HENT:  Negative for hearing loss and tinnitus.   Eyes:  Negative for blurred vision and double vision.  Respiratory:  Negative for cough and hemoptysis.   Cardiovascular:  Negative for chest pain and palpitations.  Gastrointestinal:  Positive for abdominal pain and vomiting. Negative for blood in stool, constipation, diarrhea, heartburn, melena and nausea.  Genitourinary:  Negative for dysuria and urgency.  Musculoskeletal:   Negative for myalgias and neck pain.  Skin:  Negative for itching and rash.  Neurological:  Negative for seizures and loss of consciousness.  Psychiatric/Behavioral:  Positive for substance abuse. Negative for suicidal ideas. The patient is not nervous/anxious.     Physical Exam:Physical Exam Constitutional:      Appearance: Normal appearance.  HENT:     Head: Normocephalic and atraumatic.     Nose: Nose normal. No congestion.     Mouth/Throat:     Mouth: Mucous membranes are moist.     Pharynx: Oropharynx is clear.  Eyes:     Extraocular Movements: Extraocular movements intact.     Conjunctiva/sclera: Conjunctivae normal.  Cardiovascular:     Rate and Rhythm: Normal rate and regular rhythm.  Pulmonary:     Effort: Pulmonary effort is normal. No respiratory distress.  Abdominal:     General: Abdomen is flat. Bowel sounds are normal. There is no distension.     Palpations: Abdomen is soft. There is no mass.     Tenderness: There is no abdominal tenderness. There is no guarding or rebound.     Hernia: No hernia is present.  Musculoskeletal:        General: No swelling. Normal range of motion.     Cervical back: Normal range of motion and neck supple.  Skin:    General: Skin is warm and dry.  Neurological:     General: No focal deficit present.     Mental Status: He is alert and oriented to person, place, and time.  Psychiatric:        Mood and Affect: Mood normal.        Behavior: Behavior normal.        Thought Content: Thought content normal.        Judgment: Judgment normal.    Vital signs: Vitals:   02/06/21 0900 02/06/21 1000  BP: 100/66 99/66  Pulse: (!) 47 61  Resp: 15 (!) 21  Temp:    SpO2: 98% 100%        GI:  Lab Results: Recent Labs    02/05/21 2130 02/06/21 0340  WBC 5.8 5.3  HGB 10.6* 10.3*  HCT 34.8* 34.5*  PLT 453* 428*   BMET Recent Labs    02/05/21 2105  NA 139  K 4.4  CL 107  CO2 26  GLUCOSE 101*  BUN 16  CREATININE 0.72   CALCIUM 9.3   LFT No results for input(s): PROT, ALBUMIN, AST, ALT, ALKPHOS, BILITOT, BILIDIR, IBILI in the last 72 hours. PT/INR Recent Labs    02/05/21 2113  LABPROT 12.2  INR 0.9     Studies/Results: DG ESOPHAGUS W SINGLE CM (SOL OR THIN BA)  Result Date: 02/06/2021 CLINICAL DATA:  Dysphagia, nausea, history of esophageal tear 3 months prior per patient EXAM: ESOPHOGRAM/BARIUM SWALLOW TECHNIQUE: Single contrast examination was performed using thin barium and water soluble. FLUOROSCOPY TIME:  Fluoroscopy Time:  54 seconds Radiation Exposure Index (if provided by the fluoroscopic device): 2.1 mGy COMPARISON:  None. FINDINGS:  Initial swallows with water-soluble contrast demonstrate no evidence of perforation. No aspiration. There is persistent narrowing of the esophagus distally with some holdup but no obstruction. This appears to be symptomatic. No hiatal hernia. IMPRESSION: Persistent narrowing of the distal esophagus with resulting holdup but no obstruction to contrast passage. May reflect esophagitis and/or stricture. Of note, there was severe lower esophageal wall thickening on a December 2022 chest CT. Electronically Signed   By: Guadlupe Spanish M.D.   On: 02/06/2021 11:30    Impression: Dysphagia - DG esophagus w single contrast 1/26: persistent narrowing of distal esophagus with resulting hold up, but no obstruction to contrast passage. Possible esophagitis and/or stricture. Severe lower esophageal wall thickening on CT December 2022.   Plan: Plan for EGD tomorrow. I thoroughly discussed the procedures to include nature, alternatives, benefits, and risks including but not limited to bleeding, perforation, infection, anesthesia/cardiac and pulmonary complications. Patient provides understanding and gave verbal consent to proceed. Continue Protonix 40mg  BID Continue clear liquid diet NPO at midnight Continue anti-emetics and supportive care as needed. [facility] will follow.        LOS: 0 days    PA-C 02/06/2021, 11:43 AM  Contact #  [phone]

## 2021-02-06 NOTE — Assessment & Plan Note (Signed)
Still can't take any solid food by mouth -Continue PPI - Continue IV fluids  - Consult GI, appreciate cares - May have clears, NPO at midnight for EGD tomorrow

## 2021-02-06 NOTE — Consult Note (Signed)
[facility]ician:  System, Provider Not In Primary Gastroenterologist:  [facility] FitzUnassigned [facility]  Reason for Consultation:  Dysphagia  HPI: Edward Mayer is a 36 y.o. male medical history significant for polysubstance abuse, history of VF arrest, chronic systolic heart failure presents for evaluation of dysphagia.  Patient has chronic dysphagia with numerous hospitalizations for same. [facility] presented with melena/hematemesis. Hospital course was complicated by torsades v.fib arrest. This was thought to be from prolonged QT.  EGD 10/24/20 inpatient: esophagitis with esophageal ulcer. Patient was placed on PPI. He was noncompliant as an outpatient due to cost of medication.  DG esophagus w single contrast 1/26: persistent narrowing of distal esophagus with resulting hold up, but no obstruction to contrast passage. Possible esophagitis and/or stricture. Severe lower esophageal wall thickening on CT December 2022.  Patient states 3 days ago his dysphagia acutely worsened. He states any time he eats or drinks anything it gets stuck in his chest. Sometimes results in him having to vomit it back up. Denies hematemesis. Patient states he saw dark stools a few days ago but those have resolved. Denies weight loss. Denies family history of GI issues or colon cancer. Patient is currently undergoing a detox program. Last used fentanyl, meth, marijuana a few weeks ago. Denies alcohol use. Reports 400 mg ibuprofen use a few times per week for headaches.   Past Medical History:  Diagnosis Date   Anxiety    Depression    Prolonged QT interval     History reviewed. No pertinent surgical history.  Prior to Admission medications   Medication Sig Start Date End Date Taking? Authorizing Provider  Melatonin 10 MG TABS Take 20 mg by mouth at bedtime.   Yes [provider]  pantoprazole (PROTONIX) 40 MG tablet Take 40 mg by mouth daily before  breakfast.   Yes [provider]  promethazine (PHENERGAN) 12.5 MG tablet Take 1 tablet (12.5 mg total) by mouth every 8 (eight) hours as needed for nausea or vomiting. Patient not taking: Reported on 02/06/2021 04/03/15   Audry PiliMohr, [place], PA-C    Scheduled Meds:  Melene Muller[START ON 02/07/2021] pantoprazole (PROTONIX) IV  40 mg Intravenous QHS   Continuous Infusions:  sodium chloride 75 mL/hr at 02/06/21 0120   PRN Meds:.  Allergies as of 02/05/2021   (No Known Allergies)    History reviewed. No pertinent family history.  Social History   Socioeconomic History   Marital status: Single    Spouse name: Not on file   Number of children: Not on file   Years of education: Not on file   Highest education level: Not on file  Occupational History   Not on file  Tobacco Use   Smoking status: Every Day    Packs/day: 1.00    Types: Cigarettes   Smokeless tobacco: Never  Substance and Sexual Activity   Alcohol use: Yes    Comment: few a week    Drug use: Yes    Types: Cocaine, Marijuana, Methamphetamines   Sexual activity: Never  Other Topics Concern   Not on file  Social History Narrative   Not on file   Social Determinants of Health   Financial Resource Strain: Not on file  Food Insecurity: Not on file  Transportation Needs: Not on file  Physical Activity: Not on file  Stress: Not on file  Social Connections: Not on file  Intimate Partner Violence: Not on file    Review of Systems: Review  of Systems  Constitutional:  Negative for chills and fever.  HENT:  Negative for hearing loss and tinnitus.   Eyes:  Negative for blurred vision and double vision.  Respiratory:  Negative for cough and hemoptysis.   Cardiovascular:  Negative for chest pain and palpitations.  Gastrointestinal:  Positive for abdominal pain and vomiting. Negative for blood in stool, constipation, diarrhea, heartburn, melena and nausea.  Genitourinary:  Negative for dysuria and urgency.  Musculoskeletal:   Negative for myalgias and neck pain.  Skin:  Negative for itching and rash.  Neurological:  Negative for seizures and loss of consciousness.  Psychiatric/Behavioral:  Positive for substance abuse. Negative for suicidal ideas. The patient is not nervous/anxious.     Physical Exam:Physical Exam Constitutional:      Appearance: Normal appearance.  HENT:     Head: Normocephalic and atraumatic.     Nose: Nose normal. No congestion.     Mouth/Throat:     Mouth: Mucous membranes are moist.     Pharynx: Oropharynx is clear.  Eyes:     Extraocular Movements: Extraocular movements intact.     Conjunctiva/sclera: Conjunctivae normal.  Cardiovascular:     Rate and Rhythm: Normal rate and regular rhythm.  Pulmonary:     Effort: Pulmonary effort is normal. No respiratory distress.  Abdominal:     General: Abdomen is flat. Bowel sounds are normal. There is no distension.     Palpations: Abdomen is soft. There is no mass.     Tenderness: There is no abdominal tenderness. There is no guarding or rebound.     Hernia: No hernia is present.  Musculoskeletal:        General: No swelling. Normal range of motion.     Cervical back: Normal range of motion and neck supple.  Skin:    General: Skin is warm and dry.  Neurological:     General: No focal deficit present.     Mental Status: He is alert and oriented to person, place, and time.  Psychiatric:        Mood and Affect: Mood normal.        Behavior: Behavior normal.        Thought Content: Thought content normal.        Judgment: Judgment normal.    Vital signs: Vitals:   02/06/21 0900 02/06/21 1000  BP: 100/66 99/66  Pulse: (!) 47 61  Resp: 15 (!) 21  Temp:    SpO2: 98% 100%        GI:  Lab Results: Recent Labs    02/05/21 2130 02/06/21 0340  WBC 5.8 5.3  HGB 10.6* 10.3*  HCT 34.8* 34.5*  PLT 453* 428*   BMET Recent Labs    02/05/21 2105  NA 139  K 4.4  CL 107  CO2 26  GLUCOSE 101*  BUN 16  CREATININE 0.72   CALCIUM 9.3   LFT No results for input(s): PROT, ALBUMIN, AST, ALT, ALKPHOS, BILITOT, BILIDIR, IBILI in the last 72 hours. PT/INR Recent Labs    02/05/21 2113  LABPROT 12.2  INR 0.9     Studies/Results: DG ESOPHAGUS W SINGLE CM (SOL OR THIN BA)  Result Date: 02/06/2021 CLINICAL DATA:  Dysphagia, nausea, history of esophageal tear 3 months prior per patient EXAM: ESOPHOGRAM/BARIUM SWALLOW TECHNIQUE: Single contrast examination was performed using thin barium and water soluble. FLUOROSCOPY TIME:  Fluoroscopy Time:  54 seconds Radiation Exposure Index (if provided by the fluoroscopic device): 2.1 mGy COMPARISON:  None. FINDINGS:  Initial swallows with water-soluble contrast demonstrate no evidence of perforation. No aspiration. There is persistent narrowing of the esophagus distally with some holdup but no obstruction. This appears to be symptomatic. No hiatal hernia. IMPRESSION: Persistent narrowing of the distal esophagus with resulting holdup but no obstruction to contrast passage. May reflect esophagitis and/or stricture. Of note, there was severe lower esophageal wall thickening on a December 2022 chest CT. Electronically Signed   By: Guadlupe Spanish M.D.   On: 02/06/2021 11:30    Impression: Dysphagia - DG esophagus w single contrast 1/26: persistent narrowing of distal esophagus with resulting hold up, but no obstruction to contrast passage. Possible esophagitis and/or stricture. Severe lower esophageal wall thickening on CT December 2022.   Plan: Plan for EGD tomorrow. I thoroughly discussed the procedures to include nature, alternatives, benefits, and risks including but not limited to bleeding, perforation, infection, anesthesia/cardiac and pulmonary complications. Patient provides understanding and gave verbal consent to proceed. Continue Protonix 40mg  BID Continue clear liquid diet NPO at midnight Continue anti-emetics and supportive care as needed. [facility] will follow.        LOS: 0 days    PA-C 02/06/2021, 11:43 AM  Contact #  [phone]

## 2021-02-06 NOTE — Hospital Course (Signed)
Edward Mayer is a 36 y.o. 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  In the ER, VSS, Hgb 10.6 down from 11.8, UDS noted THC, Given GI cocktail but vomited immediately afterwards.

## 2021-02-06 NOTE — Assessment & Plan Note (Signed)
In rehab, currently abstinent for last month

## 2021-02-06 NOTE — Progress Notes (Signed)
°  Progress Note   Patient: Edward Mayer:[id number] DOB: 08/20/1985 DOA: 02/05/2021     0 DOS: the patient was seen and examined on 02/06/2021   Brief hospital course: Edward Mayer is a 36 y.o. 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  In the ER, VSS, Hgb 10.6 down from 11.8, UDS noted THC, Given GI cocktail but vomited immediately afterwards.  Assessment and Plan * Dysphagia Still can't take any solid food by mouth -Continue PPI - Continue IV fluids  - Consult GI, appreciate cares - May have clears, NPO at midnight for EGD tomorrow  History esophagitis with esophageal ulcer- (present on admission) See above  Congenital long QT syndrome    Chronic systolic CHF (congestive heart failure) (HCC) Reportedly diagnosed last Oct at [facility], pt was admitted for melena, had torsades and V. Fib arrest, found to have EF 45% at that time, congenital prolonged QT.    Had follow up appt in Nov, feels he can go back.  Stopped taking his Coreg and lisinopril because he thought they were just BP meds  - Recommend Cardiology follow up after discharge  Iron deficiency anemia due to chronic blood loss - Start oral iron at discahrge  Polysubstance abuse (HCC) In rehab, currently abstinent for last month     Subjective: No fever, confusion.  Still cannot take anything by mouth.  Except for his liquids.  No nausea.  Objective Vital signs were reviewed and unremarkable. Thin adult male, lying in bed, no acute distress.  Heart rate regular, soft systolic murmur, no JVD or lower extremity edema.  Normal respiratory rate and rhythm, lungs clear without rales or wheezes.  Attention normal, affect appropriate, judgment Syprine normal  Data Reviewed: My review of labs and imaging is notable for patient metabolic panel with  normal electrolytes iron panel with low iron saturation, complete blood count with mild anemia, elevated lipase which is nonspecific, negative alcohol level, UDS unremarkable, INR normal.     Disposition: Status is: Inpatient  Remains inpatient appropriate because: He is not able to take solid food.  EGD tomorrow, hopefully he will be dilated, and after treatment be able to take solid food and discharge at the discretion of gastroenterology            Author: Alberteen Sam, MD 02/06/2021 5:36 PM  For on call review [email or web address].

## 2021-02-06 NOTE — H&P (Signed)
History and Physical    Edward Mayer EAV:[id number] DOB: 13-May-1985 DOA: 02/05/2021  PCP: System, Provider Not In  Patient coming from: Home  I have personally briefly reviewed patient[facility]  Chief Complaint: Dysphagia, nausea vomiting  HPI: Edward FettersJeremy C Mayer is a 36 y.o. male with medical history significant for polysubstance abuse, history of VF arrest, chronic systolic heart failure, GI bleed who presents with concerns of dysphagia, nausea and vomiting.  Reports that he has had chronic dysphagia for about 3 years and has had numerous hospitalization regarding this.  About 2 days ago, he began to have worsening dysphagia and will feel choked up with shortness of breath and then would vomit immediately.  Has not even been able to tolerate ice chips.  No hematemesis.  Only has abdominal pain with vomiting.  No diarrhea or constipation but did notice dark stools about 3 days ago.  He was noted to have [facility].  Back in mid October 2022, he presented with hematemesis and melena.  Hospital course was complicated by torsades with V. fib arrest.  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  He underwent EGD on 10/13 and was found to have esophagitis with esophageal ulcer.  He was placed on PPI which she has not been taking and was lost to follow-up to repeat EGD.  Most recent hospitalization in 12/8-12/9 when he presented with hematemesis but due to stable hemoglobin no EGD was recommended by GI.  Patient then refused further blood draw and was discharged.  He also reports that he is currently undergoing detox with a program outpatient.  Last used fentanyl, meth and marijuana around early part of this month.  ED Course: He was afebrile and normotensive on room air.No leukocytosis, hemoglobin at 10.6 with previous hemoglobin a month ago at 11.8. BMP was unremarkable. EKG on my review with  NSR and early repolarization pattern.  UDS positive for THC.  He was given GI cocktail in ED but vomited immediately afterwards.  ED physician discussed with [facility] who recommended barium swallow and can consult in the morning as needed.  Hospitalist then called for admission.  Review of Systems: Constitutional: No Weight Change, No Fever ENT/Mouth: No sore throat Cardiovascular: No Chest Pain, + SOB Respiratory: + Cough Gastrointestinal: + Nausea, + Vomiting, No Diarrhea, No Constipation, + Pain Musculoskeletal: No Arthralgias, No Myalgias Skin: No Skin Lesions, No Pruritus, Psych: +decrease appetite Heme/Lymph: No Bruising, + Bleeding  Past Medical History:  Diagnosis Date   Anxiety    Depression    Prolonged QT interval     History reviewed. No pertinent surgical history.   reports that he has been smoking cigarettes. He has been smoking an average of 1 pack per day. He has never used smokeless tobacco. He reports current alcohol use. He reports current drug use. Drugs: Cocaine, Marijuana, and Methamphetamines. Social History  No Known Allergies  History reviewed. No pertinent family history.   Prior to Admission medications   Medication Sig Start Date End Date Taking? Authorizing Provider  ibuprofen (ADVIL) 200 MG tablet Take 400 mg by mouth every 6 (six) hours as needed for mild pain or headache.   Yes [provider]  Melatonin 10 MG TABS Take 20 mg by mouth at bedtime.   Yes [provider]  pantoprazole (PROTONIX) 40 MG tablet Take 40 mg by mouth daily before breakfast.   Yes [provider]  promethazine (PHENERGAN) 12.5 MG tablet Take 1 tablet (12.5 mg total) by mouth every 8 (eight) hours as needed for nausea or vomiting. Patient not taking: Reported on 02/06/2021 04/03/15   Audry Pili, PA-C    Physical Exam: Vitals:   02/05/21 2230 02/05/21 2300 02/05/21 2330 02/06/21 0000  BP: 117/78 120/79 114/73 114/77  Pulse: 67 69 68 76   Resp: (!) 26 (!) 22 (!) 25 (!) 21  Temp:      TempSrc:      SpO2: 97% 99% 97% 100%    Constitutional: NAD, calm, comfortable, nontoxic appearing young male sitting up in bed Vitals:   02/05/21 2230 02/05/21 2300 02/05/21 2330 02/06/21 0000  BP: 117/78 120/79 114/73 114/77  Pulse: 67 69 68 76  Resp: (!) 26 (!) 22 (!) 25 (!) 21  Temp:      TempSrc:      SpO2: 97% 99% 97% 100%   Eyes: lids and conjunctivae normal ENMT: Mucous membranes are moist.  Neck: normal, supple Respiratory: clear to auscultation bilaterally, no wheezing, no crackles. Normal respiratory effort.  Cardiovascular: Regular rate and rhythm, no murmurs / rubs / gallops. No extremity edema. Abdomen: no tenderness, no masses palpated.  Bowel sounds positive.  Musculoskeletal: no clubbing / cyanosis. No joint deformity upper and lower extremities. Good ROM, no contractures. Normal muscle tone.  Skin: no rashes, lesions, ulcers. No induration Neurologic: CN 2-12 grossly intact. Strength 5/5 in all 4.  Psychiatric: Normal judgment and insight. Alert and oriented x 3. Normal mood and in good spirits.     Labs on Admission: I have personally reviewed following labs and imaging studies  CBC: Recent Labs  Lab 02/05/21 2130  WBC 5.8  HGB 10.6*  HCT 34.8*  MCV 80.6  PLT 453*   Basic Metabolic Panel: Recent Labs  Lab 02/05/21 2105  NA 139  K 4.4  CL 107  CO2 26  GLUCOSE 101*  BUN 16  CREATININE 0.72  CALCIUM 9.3   GFR: CrCl cannot be calculated (Unknown ideal weight.). Liver Function Tests: No results for input(s): AST, ALT, ALKPHOS, BILITOT, PROT, ALBUMIN in the last 168 hours. Recent Labs  Lab 02/05/21 2113  LIPASE 63*   No results for input(s): AMMONIA in the last 168 hours. Coagulation Profile: Recent Labs  Lab 02/05/21 2113  INR 0.9   Cardiac Enzymes: No results for input(s): CKTOTAL, CKMB, CKMBINDEX, TROPONINI in the last 168 hours. BNP (last 3 results) No results for input(s): PROBNP  in the last 8760 hours. HbA1C: No results for input(s): HGBA1C in the last 72 hours. CBG: No results for input(s): GLUCAP in the last 168 hours. Lipid Profile: No results for input(s): CHOL, HDL, LDLCALC, TRIG, CHOLHDL, LDLDIRECT in the last 72 hours. Thyroid Function Tests: No results for input(s): TSH, T4TOTAL, FREET4, T3FREE, THYROIDAB in the last 72 hours. Anemia Panel: No results for input(s): VITAMINB12, FOLATE, FERRITIN, TIBC, IRON, RETICCTPCT in the last 72 hours. Urine analysis:    Component Value Date/Time   COLORURINE YELLOW 06/29/2015 0418   APPEARANCEUR CLOUDY (A) 06/29/2015 0418   LABSPEC 1.023 06/29/2015 0418   PHURINE 6.5 06/29/2015 0418   GLUCOSEU NEGATIVE 06/29/2015 0418   HGBUR NEGATIVE 06/29/2015 0418   BILIRUBINUR NEGATIVE 06/29/2015 0418   KETONESUR NEGATIVE 06/29/2015 0418   PROTEINUR NEGATIVE 06/29/2015 0418   UROBILINOGEN 1.0 07/30/2012 2156   NITRITE NEGATIVE 06/29/2015 0418   LEUKOCYTESUR NEGATIVE 06/29/2015 0418    Radiological Exams on Admission: No results found.  Assessment/Plan  Nausea/vomiting and melena concerning for recurrent esophagitis and ulcer -Last EGD on 10/24/2020 at outside facility found to have esophagitis with esophageal ulcer.  Has not been compliant with PPI. -Continue IV daily PPI -[facility] was consulted by ED physician and recommended barium swallow.  However patient not even able to tolerate ice chips at this time.  Will need to consult GI formally tomorrow for likely repeat EGD.  Polysubstance abuse  reports being in outpatient detox for Fentanyl, methamphetamine use UDS is + for [facility]   DVT prophylaxis:.SCD Code Status: Full Family Communication: Plan discussed with patient at bedside  disposition Plan: Home with observation Consults called:  Admission status: Observation  Level of care: Telemetry  Status is: Observation  The patient remains OBS appropriate and will d/c before 2 midnights.         Anselm Jungling DO [facility]   If 7PM-7AM, please contact night-coverage [email or web address]   02/06/2021, 12:56 AM

## 2021-02-06 NOTE — ED Notes (Signed)
Patient is asleep.  

## 2021-02-06 NOTE — Assessment & Plan Note (Addendum)
Reportedly diagnosed last Oct at [place], pt was admitted for melena, had torsades and V. Fib arrest, found to have EF 45% at that time, congenital prolonged QT.    Had follow up appt in Nov, feels he can go back.  Stopped taking his Coreg and lisinopril because he thought they were just BP meds  - Recommend Cardiology follow up after discharge

## 2021-02-06 NOTE — Assessment & Plan Note (Signed)
See above

## 2021-02-07 ENCOUNTER — Encounter (HOSPITAL_COMMUNITY): Payer: Self-pay | Admitting: Family Medicine

## 2021-02-07 ENCOUNTER — Inpatient Hospital Stay (HOSPITAL_COMMUNITY): Payer: 59 | Admitting: Registered Nurse

## 2021-02-07 ENCOUNTER — Other Ambulatory Visit (HOSPITAL_COMMUNITY): Payer: Self-pay

## 2021-02-07 ENCOUNTER — Encounter (HOSPITAL_COMMUNITY)
Admission: EM | Disposition: A | Discharge status: Home / Self Care | Payer: Self-pay | Source: Home / Self Care | Attending: Family Medicine

## 2021-02-07 DIAGNOSIS — R1319 Other dysphagia: Secondary | ICD-10-CM

## 2021-02-07 DIAGNOSIS — K222 Esophageal obstruction: Secondary | ICD-10-CM

## 2021-02-07 HISTORY — PX: ESOPHAGOGASTRODUODENOSCOPY: SHX5428

## 2021-02-07 HISTORY — PX: BIOPSY: SHX5522

## 2021-02-07 LAB — BASIC METABOLIC PANEL
Anion gap: 8 (ref 5–15)
BUN: 11 mg/dL (ref 6–20)
CO2: 25 mmol/L (ref 22–32)
Calcium: 9.2 mg/dL (ref 8.9–10.3)
Chloride: 106 mmol/L (ref 98–111)
Creatinine, Ser: 0.9 mg/dL (ref 0.61–1.24)
GFR, Estimated: >60 mL/min (ref >60)
Glucose, Bld: 94 mg/dL (ref 70–99)
Potassium: 4 mmol/L (ref 3.5–5.1)
Sodium: 139 mmol/L (ref 135–145)

## 2021-02-07 SURGERY — EGD (ESOPHAGOGASTRODUODENOSCOPY)
Anesthesia: Monitor Anesthesia Care

## 2021-02-07 MED ORDER — LIDOCAINE 2% (20 MG/ML) 5 ML SYRINGE
INTRAMUSCULAR | Status: DC | PRN
  Administered 2021-02-07: 100 mg via INTRAVENOUS

## 2021-02-07 MED ORDER — PROPOFOL 10 MG/ML IV BOLUS
INTRAVENOUS | Status: DC | PRN
  Administered 2021-02-07: 20 mg via INTRAVENOUS
  Administered 2021-02-07 (×2): 40 mg via INTRAVENOUS

## 2021-02-07 MED ORDER — PROPOFOL 1000 MG/100ML IV EMUL
INTRAVENOUS | Status: AC
  Filled 2021-02-07: qty 100

## 2021-02-07 MED ORDER — SUCRALFATE 1 G PO TABS
1.0000 g | ORAL_TABLET | ORAL | 0 refills | Status: DC
  Filled 2021-02-07: qty 42, 11d supply, fill #0

## 2021-02-07 MED ORDER — SUCRALFATE 1 GM/10ML PO SUSP
1.0000 g | Freq: Three times a day (TID) | ORAL | Status: DC
  Administered 2021-02-07 – 2021-02-08 (×3): 1 g via ORAL
  Filled 2021-02-07 (×3): qty 10

## 2021-02-07 MED ORDER — SUCRALFATE 1 GM/10ML PO SUSP: 1.0000 g | Freq: Three times a day (TID) | ORAL | 0 refills | Status: DC

## 2021-02-07 MED ORDER — PANTOPRAZOLE SODIUM 40 MG PO TBEC: 40.0000 mg | DELAYED_RELEASE_TABLET | Freq: Two times a day (BID) | ORAL | 0 refills | Status: DC

## 2021-02-07 MED ORDER — PANTOPRAZOLE SODIUM 40 MG PO TBEC
40.0000 mg | DELAYED_RELEASE_TABLET | Freq: Two times a day (BID) | ORAL | 0 refills | Status: DC
  Filled 2021-02-07: qty 60, 30d supply, fill #0

## 2021-02-07 MED ORDER — LACTATED RINGERS IV SOLN
INTRAVENOUS | Status: AC | PRN
  Administered 2021-02-07: 1000 mL via INTRAVENOUS

## 2021-02-07 MED ORDER — SODIUM CHLORIDE 0.9 % IV SOLN: INTRAVENOUS | Status: DC

## 2021-02-07 MED ORDER — SUCRALFATE 1 GM/10ML PO SUSP
1.0000 g | Freq: Three times a day (TID) | ORAL | 0 refills | Status: DC
  Filled 2021-02-07: qty 420, 11d supply, fill #0

## 2021-02-07 MED ORDER — PROPOFOL 500 MG/50ML IV EMUL
INTRAVENOUS | Status: DC | PRN
  Administered 2021-02-07: 150 ug/kg/min via INTRAVENOUS

## 2021-02-07 NOTE — Progress Notes (Signed)
PROGRESS NOTE    Edward Mayer  XAJ:[id number] DOB: [birth year] DOA: 02/05/2021 PCP: System, Provider Not In    Chief Complaint  Patient presents with   Esophagitis    Brief Narrative:  Edward Mayer is a 36 y.o. 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   In the ER, VSS, Hgb 10.6 down from 11.8, UDS noted THC, Given GI cocktail but vomited immediately afterwards  Subjective:   Patient is seen after returned from EGD, reports feeling better, but not ready to go home yet, he would like to see if he could tolerate diet  He also reports has no insurance , no pcp, he is currently in a program for drug addiction  Assessment & Plan:   Principal Problem:   Dysphagia Active Problems:   History esophagitis with esophageal ulcer   Polysubstance abuse (HCC)   Iron deficiency anemia due to chronic blood loss   Chronic systolic CHF (congestive heart failure) (HCC)   Congenital long QT syndrome   Dysphagia -S/p EGD today showed esophageal ulcer without bleeding, biopsy pending -GI recommended PPI twice daily for 6-month, Carafate 1 g 4 times daily for 4 weeks, avoid NSAIDs Advanced diet as tolerated Plan to go home tomorrow if able to tolerate diet  Chronic systolic CHF Reportedly diagnosed last Oct at [facility], pt was admitted for melena, had torsades and V. Fib arrest, found to have EF 45% at that time, congenital prolonged QT., he reports due to substance abuse   Stopped taking his Coreg and lisinopril because he thought they were just BP meds He agrees to go back on these meds if his blood pressure allows Currently euvolemic F/u with cardiology   Polysubstance abuse Currently in rehab program in [place], state will be in the renal program for the next 29-month currently abstinent for last month     Body  mass index is 23.3 kg/m.Marland Kitchen      Unresulted Labs (From admission, onward)     Start     Ordered   02/06/21 0055  Occult blood card to lab, stool  Once,   R        02/06/21 0054              DVT prophylaxis: SCDs Start: 02/06/21 0054   Code Status: Full Family Communication: Patient Disposition:   Status is: Inpatient  Dispo: The patient is from: drug rehab program              Anticipated d/c is to: drug rehab program              Anticipated d/c date is: 1/28 if able to tolerate diet advancement                 Objective: Vitals:   02/07/21 1116 02/07/21 1120 02/07/21 1136 02/07/21 1419  BP:  102/67 108/73 114/67  Pulse: 73 78 (!) 54 67  Resp: 14 20 18 17   Temp:   98.2 F (36.8 C) 98.4 F (36.9 C)  TempSrc:   Oral Oral  SpO2: 100% 100%  100%  Weight:      Height:        Intake/Output Summary (Last 24 hours) at 02/07/2021 1447 Last data filed at 02/07/2021 1300 Gross per 24 hour  Intake 2630.93 ml  Output --  Net 2630.93 ml  Filed Weights   02/06/21 1242 02/07/21 0956  Weight: 63.5 kg 63.5 kg    Examination:  General exam: alert, awake, communicative,calm, NAD Respiratory system: Clear to auscultation. Respiratory effort normal. Cardiovascular system:  RRR.  Gastrointestinal system: Abdomen is nondistended, soft and nontender.  Normal bowel sounds heard. Central nervous system: Alert and oriented. No focal neurological deficits. Extremities:  no edema Skin: No rashes, lesions or ulcers Psychiatry: Judgement and insight appear normal. Mood & affect appropriate.     Data Reviewed: I have personally reviewed following labs and imaging studies  CBC: Recent Labs  Lab 02/05/21 2130 02/06/21 0340  WBC 5.8 5.3  HGB 10.6* 10.3*  HCT 34.8* 34.5*  MCV 80.6 80.6  PLT 453* 428*    Basic Metabolic Panel: Recent Labs  Lab 02/05/21 2105 02/07/21 0515  NA 139 139  K 4.4 4.0  CL 107 106  CO2 26 25  GLUCOSE 101* 94  BUN 16 11  CREATININE  0.72 0.90  CALCIUM 9.3 9.2    GFR: Estimated Creatinine Clearance: 98.7 mL/min (by C-G formula based on SCr of 0.9 mg/dL).  Liver Function Tests: No results for input(s): AST, ALT, ALKPHOS, BILITOT, PROT, ALBUMIN in the last 168 hours.  CBG: No results for input(s): GLUCAP in the last 168 hours.   Recent Results (from the past 240 hour(s))  Resp Panel by RT-PCR (Flu A&B, Covid) Nasopharyngeal Swab     Status: None   Collection Time: 02/06/21  2:40 AM   Specimen: Nasopharyngeal Swab; Nasopharyngeal(NP) swabs in vial transport medium  Result Value Ref Range Status   SARS Coronavirus 2 by RT PCR NEGATIVE NEGATIVE Final    Comment: (NOTE) SARS-CoV-2 target nucleic acids are NOT DETECTED.  The SARS-CoV-2 RNA is generally detectable in upper respiratory specimens during the acute phase of infection. The lowest concentration of SARS-CoV-2 viral copies this assay can detect is 138 copies/mL. A negative result does not preclude SARS-Cov-2 infection and should not be used as the sole basis for treatment or other patient management decisions. A negative result may occur with  improper specimen collection/handling, submission of specimen other than nasopharyngeal swab, presence of viral mutation(s) within the areas targeted by this assay, and inadequate number of viral copies(<138 copies/mL). A negative result must be combined with clinical observations, patient history, and epidemiological information. The expected result is Negative.  Fact Sheet for Patients:  BloggerCourse.comhttps://www.fda.gov/media/152166/download  Fact Sheet for Healthcare Providers:  SeriousBroker.ithttps://www.fda.gov/media/152162/download  This test is no t yet approved or cleared by the [place] FDA and  has been authorized for detection and/or diagnosis of SARS-CoV-2 by FDA under an Emergency Use Authorization (EUA). This EUA will remain  in effect (meaning this test can be used) for the duration of the COVID-19 declaration under  Section 564(b)(1) of the Act, 21 U.S.C.section 360bbb-3(b)(1), unless the authorization is terminated  or revoked sooner.       Influenza A by PCR NEGATIVE NEGATIVE Final   Influenza B by PCR NEGATIVE NEGATIVE Final    Comment: (NOTE) The Xpert Xpress SARS-CoV-2/FLU/RSV plus assay is intended as an aid in the diagnosis of influenza from Nasopharyngeal swab specimens and should not be used as a sole basis for treatment. Nasal washings and aspirates are unacceptable for Xpert Xpress SARS-CoV-2/FLU/RSV testing.  Fact Sheet for Patients: BloggerCourse.comhttps://www.fda.gov/media/152166/download  Fact Sheet for Healthcare Providers: SeriousBroker.ithttps://www.fda.gov/media/152162/download  This test is not yet approved or cleared by the [place] FDA and has been authorized for detection and/or diagnosis of SARS-CoV-2 by FDA  under an Emergency Use Authorization (EUA). This EUA will remain in effect (meaning this test can be used) for the duration of the COVID-19 declaration under Section 564(b)(1) of the Act, 21 U.S.C. section 360bbb-3(b)(1), unless the authorization is terminated or revoked.  Performed at [facility], [address], [place], [place] [address]          Radiology Studies: DG ESOPHAGUS W SINGLE CM (SOL OR THIN BA)  Result Date: 02/06/2021 CLINICAL DATA:  Dysphagia, nausea, history of esophageal tear 3 months prior per patient EXAM: ESOPHOGRAM/BARIUM SWALLOW TECHNIQUE: Single contrast examination was performed using thin barium and water soluble. FLUOROSCOPY TIME:  Fluoroscopy Time:  54 seconds Radiation Exposure Index (if provided by the fluoroscopic device): 2.1 mGy COMPARISON:  None. FINDINGS: Initial swallows with water-soluble contrast demonstrate no evidence of perforation. No aspiration. There is persistent narrowing of the esophagus distally with some holdup but no obstruction. This appears to be symptomatic. No hiatal hernia. IMPRESSION: Persistent narrowing of  the distal esophagus with resulting holdup but no obstruction to contrast passage. May reflect esophagitis and/or stricture. Of note, there was severe lower esophageal wall thickening on a December 2022 chest CT. Electronically Signed   By: Guadlupe Spanish M.D.   On: 02/06/2021 11:30        Scheduled Meds:  pantoprazole (PROTONIX) IV  40 mg Intravenous Q12H   sucralfate  1 g Oral TID WC & HS   Continuous Infusions:  sodium chloride Stopped (02/07/21 0312)     LOS: 1 day    Greater than 50% of this time was spent in counseling, explanation of diagnosis, planning of further management, and coordination of care.   Voice Recognition Reubin Milan dictation system was used to create this note, attempts have been made to correct errors. Please contact the author with questions and/or clarifications.   Albertine Grates, MD PhD FACP [facility]  Available via Epic secure chat 7am-7pm for nonurgent issues Please page for urgent issues To page the attending provider between 7A-7P or the covering provider during after hours 7P-7A, please log into the web site [email or web address] and access using universal [place] password for that web site. If you do not have the password, please call the hospital operator.    02/07/2021, 2:47 PM

## 2021-02-07 NOTE — Anesthesia Postprocedure Evaluation (Signed)
Anesthesia Post Note  Patient: Edward Mayer  Procedure(Mayer) Performed: ESOPHAGOGASTRODUODENOSCOPY (EGD) Balloon dilation wire-guided BIOPSY     Patient location during evaluation: PACU Anesthesia Type: MAC Level of consciousness: awake and alert Pain management: pain level controlled Vital Signs Assessment: post-procedure vital signs reviewed and stable Respiratory status: spontaneous breathing, nonlabored ventilation, respiratory function stable and patient connected to nasal cannula oxygen Cardiovascular status: stable and blood pressure returned to baseline Postop Assessment: no apparent nausea or vomiting Anesthetic complications: no   No notable events documented.  Last Vitals:  Vitals:   02/07/21 1107 02/07/21 1116  BP: (!) 95/57   Pulse: 78 73  Resp: (!) 23 14  Temp: (!) 36.4 C   SpO2: 100% 100%    Last Pain:  Vitals:   02/07/21 1107  TempSrc: Axillary  PainSc:                  Edward Mayer

## 2021-02-07 NOTE — Transfer of Care (Signed)
Immediate Anesthesia Transfer of Care Note  Patient: Edward Mayer  Procedure(s) Performed: ESOPHAGOGASTRODUODENOSCOPY (EGD) Balloon dilation wire-guided BIOPSY  Patient Location: PACU and Endoscopy Unit  Anesthesia Type:MAC  Level of Consciousness: awake, alert , oriented and patient cooperative  Airway & Oxygen Therapy: Patient Spontanous Breathing and Patient connected to face mask oxygen  Post-op Assessment: Report given to RN, Post -op Vital signs reviewed and stable and Patient moving all extremities  Post vital signs: Reviewed and stable  Last Vitals:  Vitals Value Taken Time  BP 95/57 02/07/21 1107  Temp    Pulse 78 02/07/21 1107  Resp 23 02/07/21 1107  SpO2 100 % 02/07/21 1107    Last Pain:  Vitals:   02/07/21 0956  TempSrc:   PainSc: 0-No pain         Complications: No notable events documented.

## 2021-02-07 NOTE — Anesthesia Preprocedure Evaluation (Signed)
Anesthesia Evaluation  Patient identified by MRN, date of birth, ID band Patient awake  General Assessment Comment:Mr. Edward Mayer is a 36 y.o. 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  In the ER, VSS, Hgb 10.6 down from 11.8, UDS noted THC, Given GI cocktail but vomited immediately afterwards.  Reviewed: Allergy & Precautions, NPO status , Patient's Chart, lab work & pertinent test results  Airway Mallampati: II  TM Distance: >3 FB Neck ROM: Full    Dental no notable dental hx.    Pulmonary neg pulmonary ROS, Current Smoker,    Pulmonary exam normal breath sounds clear to auscultation       Cardiovascular +CHF  Normal cardiovascular exam Rhythm:Regular Rate:Normal     Neuro/Psych negative neurological ROS  negative psych ROS   GI/Hepatic (+)     substance abuse  cocaine use and IV drug use, GIB   Endo/Other  negative endocrine ROS  Renal/GU negative Renal ROS  negative genitourinary   Musculoskeletal negative musculoskeletal ROS (+)   Abdominal   Peds negative pediatric ROS (+)  Hematology negative hematology ROS (+) anemia ,   Anesthesia Other Findings   Reproductive/Obstetrics negative OB ROS                             Anesthesia Physical Anesthesia Plan  ASA: 3  Anesthesia Plan: MAC   Post-op Pain Management: Minimal or no pain anticipated   Induction: Intravenous  PONV Risk Score and Plan: 1 and Treatment may vary due to age or medical condition and Propofol infusion  Airway Management Planned: Simple Face Mask  Additional Equipment:   Intra-op Plan:   Post-operative Plan:   Informed Consent: I have reviewed the patients History and Physical, chart, labs and discussed the procedure including the  risks, benefits and alternatives for the proposed anesthesia with the patient or authorized representative who has indicated his/her understanding and acceptance.     Dental advisory given  Plan Discussed with: CRNA and Surgeon  Anesthesia Plan Comments:         Anesthesia Quick Evaluation

## 2021-02-07 NOTE — TOC Transition Note (Signed)
Transition of Care [facility]) - CM/SW Discharge Note   Patient Details  Name: Edward Mayer MRN: [id number] Date of Birth: [birth year]  [facility] CM/SW Contact:  Ida Rogue, LCSW Phone Number: 02/07/2021, 3:29 PM   Clinical Narrative:   TOC was contacted by MD stating patient is imminent d/c, needs help with medication [protonix and carafate] as he has no insurance.  Chart lists him with [employer].  Patient states he was recently released from prison-was told he would get insurance but never got a card, so he thought he did not have any. He has been at [facility] for the past 10 days, which is a long term drug rehab program. When I told him I could not get a MATCH voucher, he thought perhaps his brother could pay the co-pay, but he needs to know how much it is.  I had MD send scripts to [facility] outpatient pharmacy.  They will fill and we can let patient know what he owes. TOC will continue to follow during the course of hospitalization.     Final next level of care: Home/Self Care Barriers to Discharge: No Barriers Identified   Patient Goals and CMS Choice        Discharge Placement                       Discharge Plan and Services                                     Social Determinants of Health (SDOH) Interventions     Readmission Risk Interventions No flowsheet data found.

## 2021-02-07 NOTE — Interval H&P Note (Signed)
History and Physical Interval Note: 35/male with multiple comorbidities, dysphagia and abnormal Barium swallow for an EGD with dilation with propofol. 02/07/2021 10:37 AM  Edward Mayer  has presented today for EGD, with the diagnosis of dysphagia.  The various methods of treatment have been discussed with the patient and family. After consideration of risks, benefits and other options for treatment, the patient has consented to  Procedure(s): ESOPHAGOGASTRODUODENOSCOPY (EGD) (N/A) as a surgical intervention.  The patient's history has been reviewed, patient examined, no change in status, stable for surgery.  I have reviewed the patient's chart and labs.  Questions were answered to the patient's satisfaction.     Kerin Salen

## 2021-02-07 NOTE — Op Note (Signed)
[facility] Patient Name: Edward Mayer Procedure Date: 02/07/2021 MRN: [id number] Attending MD: Kerin Salen , MD Date of Birth: 10-Jan-1986 CSN: [id number] Age: 36 Admit Type: Inpatient Procedure:                Upper GI endoscopy Indications:              Dysphagia, Abnormal cine-esophagram Providers:                Kerin Salen, MD, Fransisca Connors, Harrington Challenger,                            Technician, Clare Gandy, CRNA Referring MD:             [facility] Medicines:                Monitored Anesthesia Care Complications:            No immediate complications. Estimated blood loss:                            Minimal. Estimated Blood Loss:     Estimated blood loss was minimal. Procedure:                Pre-Anesthesia Assessment:                           - Prior to the procedure, a History and Physical                            was performed, and patient medications and                            allergies were reviewed. The patient's tolerance of                            previous anesthesia was also reviewed. The risks                            and benefits of the procedure and the sedation                            options and risks were discussed with the patient.                            All questions were answered, and informed consent                            was obtained. Prior Anticoagulants: The patient has                            taken no previous anticoagulant or antiplatelet                            agents. ASA Grade Assessment: III - A patient with  severe systemic disease. After reviewing the risks                            and benefits, the patient was deemed in                            satisfactory condition to undergo the procedure.                           After obtaining informed consent, the endoscope was                            passed under direct vision. Throughout the                             procedure, the patient's blood pressure, pulse, and                            oxygen saturations were monitored continuously. The                            [id number]-H190 ([id number]) Olympus endoscope was introduced                            through the mouth, and advanced to the second part                            of duodenum. The upper GI endoscopy was                            accomplished without difficulty. The patient                            tolerated the procedure well. Scope In: Scope Out: Findings:      The upper third of the esophagus was normal.      Few superficial esophageal ulcers with no bleeding and no stigmata of       recent bleeding were found 25 to 35 cm from the incisors. Biopsies were       taken with a cold forceps for histology.      One benign-appearing, intrinsic severe (circumferential scarring or       stenosis; an endoscope cannot pass) stenosis was found 35 cm from the       incisors, 1 cm above the GE junction. The stenosis was traversed after       dilation. A TTS dilator was passed through the scope. Dilation with a       15-16.5-18 mm balloon dilator was performed to 15 mm only for a few       seconds due to presence of esophageal ulcers. The dilation site was       examined following endoscope reinsertion and showed moderate mucosal       disruption and complete resolution of luminal narrowing. Estimated blood       loss was minimal.      The entire examined stomach was normal.      The cardia and gastric  fundus were normal on retroflexion.      The examined duodenum was normal. Impression:               - Normal upper third of esophagus.                           - Esophageal ulcers with no bleeding and no                            stigmata of recent bleeding. Biopsied.                           - Benign-appearing esophageal stenosis. Dilated.                           - Normal stomach.                           - Normal examined  duodenum. Moderate Sedation:      Patient did not receive moderate sedation for this procedure, but       instead received monitored anesthesia care. Recommendation:           - Advance diet as tolerated.                           - Await pathology results.                           - Use Protonix (pantoprazole) 40 mg PO BID for 2                            months.                           - Use sucralfate suspension 1 gram PO QID for 4                            weeks.                           - No aspirin, ibuprofen, naproxen, or other                            non-steroidal anti-inflammatory drugs. Procedure Code(s):        --- Professional ---                           [phone], Esophagogastroduodenoscopy, flexible,                            transoral; with transendoscopic balloon dilation of                            esophagus (less than 30 mm diameter)                           43239, 59, Esophagogastroduodenoscopy, flexible,  transoral; with biopsy, single or multiple Diagnosis Code(s):        --- Professional ---                           K22.10, Ulcer of esophagus without bleeding                           K22.2, Esophageal obstruction                           R13.10, Dysphagia, unspecified                           R93.3, Abnormal findings on diagnostic imaging of                            other parts of digestive tract CPT copyright 2019 American Medical Association. All rights reserved. The codes documented in this report are preliminary and upon coder review may  be revised to meet current compliance requirements. Kerin SalenArya Edyth Glomb, MD 02/07/2021 11:09:11 AM This report has been signed electronically. Number of Addenda: 0

## 2021-02-07 NOTE — Discharge Instructions (Signed)
YOU HAD AN ENDOSCOPIC PROCEDURE TODAY: Refer to the procedure report and other information in the discharge instructions given to you for any specific questions about what was found during the examination. If this information does not answer your questions, please call [facility] office at [phone] to clarify.   YOU SHOULD EXPECT: Some feelings of bloating in the abdomen. Passage of more gas than usual. Walking can help get rid of the air that was put into your GI tract during the procedure and reduce the bloating. If you had a lower endoscopy (such as a colonoscopy or flexible sigmoidoscopy) you may notice spotting of blood in your stool or on the toilet paper. Some abdominal soreness may be present for a day or two, also.  DIET: Your first meal following the procedure should be a light meal and then it is ok to progress to your normal diet. A half-sandwich or bowl of soup is an example of a good first meal. Heavy or fried foods are harder to digest and may make you feel nauseous or bloated. Drink plenty of fluids but you should avoid alcoholic beverages for 24 hours. If you had a esophageal dilation, please see attached instructions for diet.    ACTIVITY: Your care partner should take you home directly after the procedure. You should plan to take it easy, moving slowly for the rest of the day. You can resume normal activity the day after the procedure however YOU SHOULD NOT DRIVE, use power tools, machinery or perform tasks that involve climbing or major physical exertion for 24 hours (because of the sedation medicines used during the test).   SYMPTOMS TO REPORT IMMEDIATELY: A gastroenterologist can be reached at any hour. Please call [phone]  for any of the following symptoms:  Following upper endoscopy (EGD, EUS, ERCP, esophageal dilation) Vomiting of blood or coffee ground material  New, significant abdominal pain  New, significant chest pain or pain under the shoulder blades  Painful or  persistently difficult swallowing  New shortness of breath  Black, tarry-looking or red, bloody stools  FOLLOW UP:  If any biopsies were taken you will be contacted by phone or by letter within the next 1-3 weeks. Call [phone]  if you have not heard about the biopsies in 3 weeks.  Please also call with any specific questions about appointments or follow up tests. YOU HAD AN ENDOSCOPIC PROCEDURE TODAY: Refer to the procedure report and other information in the discharge instructions given to you for any specific questions about what was found during the examination. If this information does not answer your questions, please call [facility] office at [phone] to clarify.   

## 2021-02-08 ENCOUNTER — Other Ambulatory Visit (HOSPITAL_COMMUNITY): Payer: Self-pay

## 2021-02-08 MED ORDER — SUCRALFATE 1 GM/10ML PO SUSP
1.0000 g | Freq: Three times a day (TID) | ORAL | 0 refills | Status: AC
  Filled 2021-02-08: qty 1200, 30d supply, fill #0

## 2021-02-08 MED ORDER — FERROUS GLUCONATE 324 (38 FE) MG PO TABS
324.0000 mg | ORAL_TABLET | Freq: Every day | ORAL | 0 refills | Status: AC
  Filled 2021-02-08: qty 30, 30d supply, fill #0

## 2021-02-08 MED ORDER — PANTOPRAZOLE SODIUM 40 MG PO TBEC
40.0000 mg | DELAYED_RELEASE_TABLET | Freq: Two times a day (BID) | ORAL | 1 refills | Status: AC
  Filled 2021-02-08: qty 60, 30d supply, fill #0

## 2021-02-08 MED ORDER — PANTOPRAZOLE SODIUM 40 MG PO TBEC
40.0000 mg | DELAYED_RELEASE_TABLET | Freq: Two times a day (BID) | ORAL | 1 refills | Status: DC
  Filled 2021-02-08: qty 60, 30d supply, fill #0

## 2021-02-08 NOTE — Plan of Care (Signed)

## 2021-02-08 NOTE — Discharge Summary (Signed)
Physician Discharge Summary  Edward Mayer:[id number] DOB: 1985-10-31 DOA: 02/05/2021  PCP: System, Provider Not In  Admit date: 02/05/2021 Discharge date: 02/08/2021  Admitted From: home Disposition:  home  Recommendations for Outpatient Follow-up:  Follow up with PCP in 1-2 weeks  Home Health: none Equipment/Devices: none  Discharge Condition: stable CODE STATUS: Full code Diet recommendation: regular  HPI: Per admitting MD, Edward Mayer is a 36 y.o. male with medical history significant for polysubstance abuse, history of VF arrest, chronic systolic heart failure, GI bleed who presents with concerns of dysphagia, nausea and vomiting. Reports that he has had chronic dysphagia for about 3 years and has had numerous hospitalization regarding this.  About 2 days ago, he began to have worsening dysphagia and will feel choked up with shortness of breath and then would vomit immediately.  Has not even been able to tolerate ice chips.  No hematemesis.  Only has abdominal pain with vomiting.  No diarrhea or constipation but did notice dark stools about 3 days ago. He was noted to have 2 recent hospitalization at [facility]. Back in mid October 2022, he presented with hematemesis and mele na.  Hospital course was complicated by torsades with V. fib arrest.  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  He underwent EGD on 10/13 and was found to have esophagitis with esophageal ulcer.  He was placed on PPI which she has not been taking and was lost to follow-up to repeat EGD. Most recent hospitalization in 12/8-12/9 when he presented with hematemesis but due to stable hemoglobin no EGD was recommended by GI.  Patient then refused further blood draw and was discharged. He also reports that he is currently undergoing detox with a program outpatient.  Last used fentanyl, meth and marijuana around early part of this month.  Hospital Course / Discharge  diagnoses: Principal problem Dysphagia-patient was admitted to the hospital with dysphagia, GI consulted and followed patient while hospitalized.  He underwent an EGD on 1/27 which showed esophageal ulcers, also stricture status post dilatation.  Following the procedure he is improved, able to tolerate a regular diet and will be discharged home in stable condition.  He will be placed on PPI/Carafate.  Avoid NSAIDs.  Active problems Chronic systolic CHF-most recent EF 45%, appears euvolemic.  Outpatient management.  Follow-up with cardiology.  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   pantoprazole 40 MG tablet Commonly known as: PROTONIX Take 1 tablet (40 mg total) by mouth 2 (two) times daily before a meal. What changed: when to take this   sucralfate 1 GM/10ML suspension Commonly known as: CARAFATE Take 10 mLs (1 g total) by mouth 4 (four) times daily -  with meals and at bedtime.         Consultations: GI  Procedures/Studies: EGD  DG ESOPHAGUS W SINGLE CM (SOL OR THIN BA)  Result Date: 02/06/2021 CLINICAL DATA:  Dysphagia, nausea, history of esophageal tear 3 months prior per patient EXAM: ESOPHOGRAM/BARIUM SWALLOW TECHNIQUE: Single contrast examination was performed using thin barium and water soluble. FLUOROSCOPY TIME:  Fluoroscopy Time:  54 seconds Radiation Exposure Index (if provided by the fluoroscopic device): 2.1 mGy COMPARISON:  None. FINDINGS: Initial swallows with  water-soluble contrast demonstrate no evidence of perforation. No aspiration. There is persistent narrowing of the esophagus distally with some holdup but no obstruction. This appears to be symptomatic. No hiatal hernia. IMPRESSION: Persistent narrowing of the distal esophagus with resulting holdup but no obstruction to contrast passage. May reflect esophagitis and/or stricture. Of note, there was severe lower esophageal wall thickening on a December 2022 chest CT. Electronically Signed   By: Guadlupe SpanishPraneil  Patel M.D.   On: 02/06/2021 11:30     Subjective: - no chest pain, shortness of breath, no abdominal pain, nausea or vomiting.   Discharge Exam: BP 103/64 (BP Location: Left Arm)    Pulse (!) 52    Temp 98.7 F (37.1 C) (Oral)    Resp 16    Ht 5\' 5"  (1.651 m)    Wt 63.5 kg    SpO2 100%    BMI 23.30 kg/m   General: Pt is alert, awake, not in acute distress Cardiovascular: RRR, S1/S2 +, no rubs, no gallops Respiratory: CTA bilaterally, no wheezing, no rhonchi Abdominal: Soft, NT, ND, bowel sounds + Extremities: no edema, no cyanosis    The results of significant diagnostics from this hospitalization (including imaging, microbiology, ancillary and laboratory) are listed below for reference.     Microbiology: Recent Results (from the past 240 hour(s))  Resp Panel by RT-PCR (Flu A&B, Covid) Nasopharyngeal Swab     Status: None   Collection Time: 02/06/21  2:40 AM   Specimen: Nasopharyngeal Swab; Nasopharyngeal(NP) swabs in vial transport medium  Result Value Ref Range Status   SARS Coronavirus 2 by RT PCR NEGATIVE NEGATIVE Final    Comment: (NOTE) SARS-CoV-2 target nucleic acids are NOT DETECTED.  The SARS-CoV-2 RNA is generally detectable in upper respiratory specimens during the acute phase of infection. The lowest concentration of SARS-CoV-2 viral copies this assay can detect is 138 copies/mL. A negative result does not preclude SARS-Cov-2 infection and should not be used as the sole  basis for treatment or other patient management decisions. A negative result may occur with  improper specimen collection/handling, submission of specimen other than nasopharyngeal swab, presence of viral mutation(s) within the areas targeted by this assay, and inadequate number of viral copies(<138 copies/mL). A negative result must be combined with clinical observations, patient history, and epidemiological information. The expected result is Negative.  Fact Sheet for Patients:  BloggerCourse.comhttps://www.fda.gov/media/152166/download  Fact Sheet for Healthcare Providers:  SeriousBroker.ithttps://www.fda.gov/media/152162/download  This test is no t yet approved or cleared by the [place] FDA and  has been authorized for detection and/or diagnosis of SARS-CoV-2 by FDA under an Emergency Use Authorization (EUA). This EUA will remain  in effect (meaning this test can be used) for the duration of the COVID-19 declaration under Section 564(b)(1) of the Act, 21 U.S.C.section 360bbb-3(b)(1), unless the authorization is terminated  or revoked sooner.       Influenza A by PCR NEGATIVE NEGATIVE Final   Influenza B by PCR NEGATIVE NEGATIVE Final    Comment: (NOTE) The Xpert Xpress SARS-CoV-2/FLU/RSV plus assay is intended as an aid in the diagnosis of influenza from Nasopharyngeal swab specimens and should not be used as a sole basis for treatment. Nasal washings  and aspirates are unacceptable for Xpert Xpress SARS-CoV-2/FLU/RSV testing.  Fact Sheet for Patients: BloggerCourse.com  Fact Sheet for Healthcare Providers: SeriousBroker.it  This test is not yet approved or cleared by the [place] FDA and has been authorized for detection and/or diagnosis of SARS-CoV-2 by FDA under an Emergency Use Authorization (EUA). This EUA will remain in effect (meaning this test can be used) for the duration of the COVID-19 declaration under Section 564(b)(1) of the Act,  21 U.S.C. section 360bbb-3(b)(1), unless the authorization is terminated or revoked.  Performed at [facility], [address] [place], [place] [address]      Labs: Basic Metabolic Panel: Recent Labs  Lab 02/05/21 2105 02/07/21 0515  NA 139 139  K 4.4 4.0  CL 107 106  CO2 26 25  GLUCOSE 101* 94  BUN 16 11  CREATININE 0.72 0.90  CALCIUM 9.3 9.2   Liver Function Tests: No results for input(s): AST, ALT, ALKPHOS, BILITOT, PROT, ALBUMIN in the last 168 hours. CBC: Recent Labs  Lab 02/05/21 2130 02/06/21 0340  WBC 5.8 5.3  HGB 10.6* 10.3*  HCT 34.8* 34.5*  MCV 80.6 80.6  PLT 453* 428*   CBG: No results for input(s): GLUCAP in the last 168 hours. Hgb A1c No results for input(s): HGBA1C in the last 72 hours. Lipid Profile No results for input(s): CHOL, HDL, LDLCALC, TRIG, CHOLHDL, LDLDIRECT in the last 72 hours. Thyroid function studies No results for input(s): TSH, T4TOTAL, T3FREE, THYROIDAB in the last 72 hours.  Invalid input(s): FREET3 Urinalysis    Component Value Date/Time   COLORURINE YELLOW 06/29/2015 0418   APPEARANCEUR CLOUDY (A) 06/29/2015 0418   LABSPEC 1.023 06/29/2015 0418   PHURINE 6.5 06/29/2015 0418   GLUCOSEU NEGATIVE 06/29/2015 0418   HGBUR NEGATIVE 06/29/2015 0418   BILIRUBINUR NEGATIVE 06/29/2015 0418   KETONESUR NEGATIVE 06/29/2015 0418   PROTEINUR NEGATIVE 06/29/2015 0418   UROBILINOGEN 1.0 07/30/2012 2156   NITRITE NEGATIVE 06/29/2015 0418   LEUKOCYTESUR NEGATIVE 06/29/2015 0418    FURTHER DISCHARGE INSTRUCTIONS:   Get Medicines reviewed and adjusted: Please take all your medications with you for your next visit with your Primary MD   Laboratory/radiological data: Please request your Primary MD to go over all hospital tests and procedure/radiological results at the follow up, please ask your Primary MD to get all Hospital records sent to his/her office.   In some cases, they will be blood work, cultures and  biopsy results pending at the time of your discharge. Please request that your primary care M.D. goes through all the records of your hospital data and follows up on these results.   Also Note the following: If you experience worsening of your admission symptoms, develop shortness of breath, life threatening emergency, suicidal or homicidal thoughts you must seek medical attention immediately by calling 911 or calling your MD immediately  if symptoms less severe.   You must read complete instructions/literature along with all the possible adverse reactions/side effects for all the Medicines you take and that have been prescribed to you. Take any new Medicines after you have completely understood and accpet all the possible adverse reactions/side effects.    Do not drive when taking Pain medications or sleeping medications (Benzodaizepines)   Do not take more than prescribed Pain, Sleep and Anxiety Medications. It is not advisable to combine anxiety,sleep and pain medications without talking with your primary care practitioner   Special Instructions: If you have smoked or chewed Tobacco  in the  last 2 yrs please stop smoking, stop any regular Alcohol  and or any Recreational drug use.   Wear Seat belts while driving.   Please note: You were cared for by a hospitalist during your hospital stay. Once you are discharged, your primary care physician will handle any further medical issues. Please note that NO REFILLS for any discharge medications will be authorized once you are discharged, as it is imperative that you return to your primary care physician (or establish a relationship with a primary care physician if you do not have one) for your post hospital discharge needs so that they can reassess your need for medications and monitor your lab values.  Time coordinating discharge: 25 minutes  SIGNED:  Pamella Pertostin Erminia Mcnew, MD, PhD 02/08/2021, 8:34 AM

## 2021-02-10 ENCOUNTER — Other Ambulatory Visit (HOSPITAL_COMMUNITY): Payer: Self-pay

## 2021-02-10 ENCOUNTER — Encounter (HOSPITAL_COMMUNITY): Payer: Self-pay | Admitting: Gastroenterology

## 2021-02-15 ENCOUNTER — Other Ambulatory Visit (HOSPITAL_COMMUNITY): Payer: Self-pay

## 2021-02-19 LAB — SURGICAL PATHOLOGY

## 2022-01-16 ENCOUNTER — Other Ambulatory Visit (HOSPITAL_COMMUNITY): Payer: Self-pay

## 2022-09-19 IMAGING — RF DG ESOPHAGUS
4 series · 14 of 16 positions shown · non-contrast
Comparison: None.

CLINICAL DATA: Dysphagia, nausea, history of esophageal tear 3
months prior per patient

EXAM:
ESOPHOGRAM/BARIUM SWALLOW
TECHNIQUE: Single contrast examination was performed using thin barium and
water soluble.
FLUOROSCOPY TIME:  Fluoroscopy Time:  54 seconds
Radiation Exposure Index (if provided by the fluoroscopic device):
2.1 mGy

[Series 1: cp_standard · 3 of 75 frames shown (1 of 4)]
[frame 12/75]
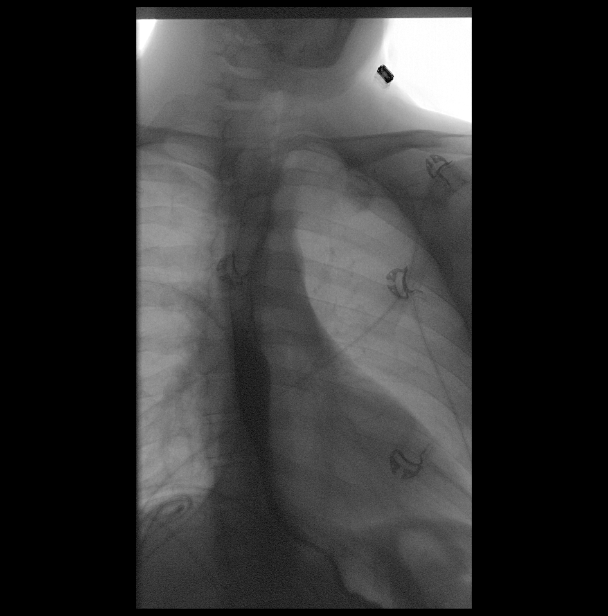
[frame 38/75]
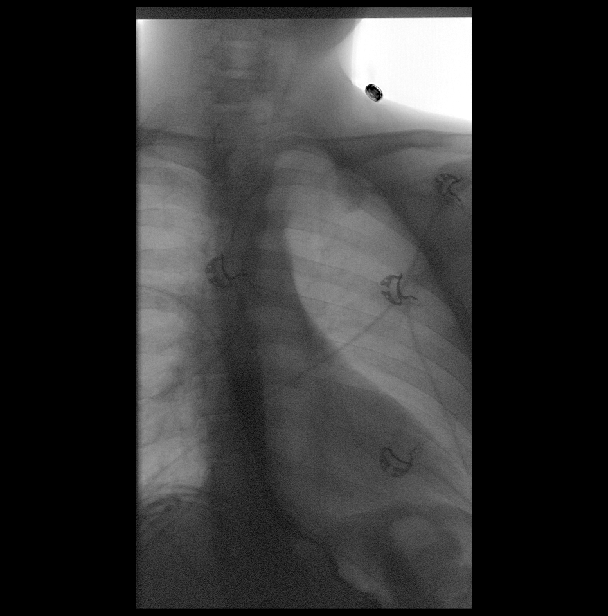
[frame 64/75]
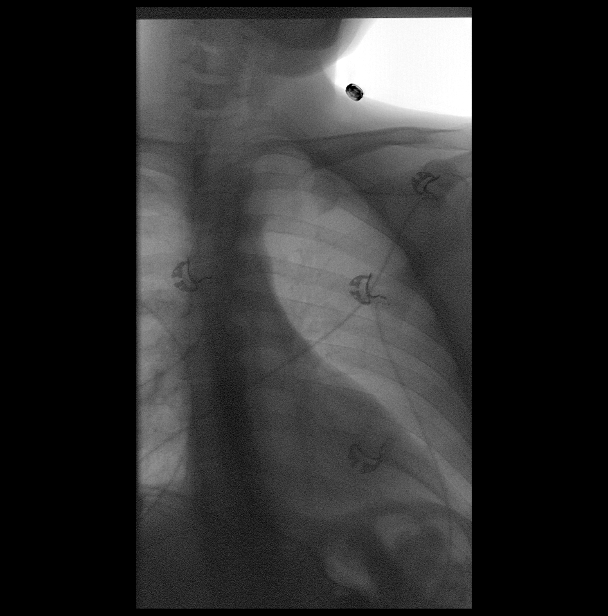

[Series 2: cp_standard · 4 of 154 frames shown (2 of 4)]
[frame 24/154]
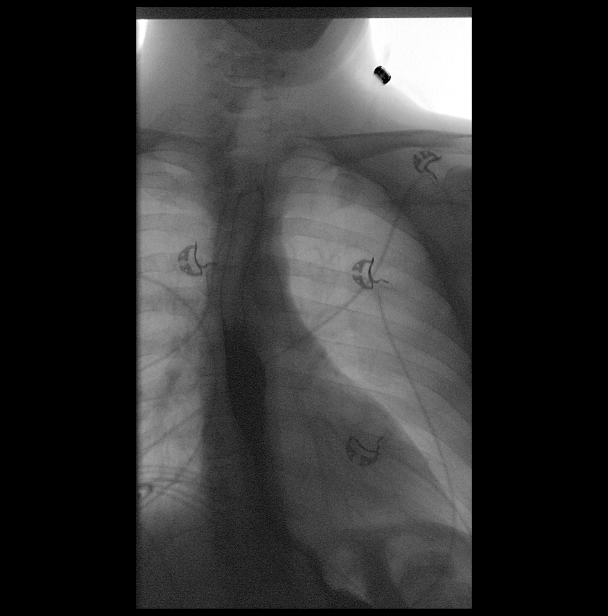
[frame 78/154]
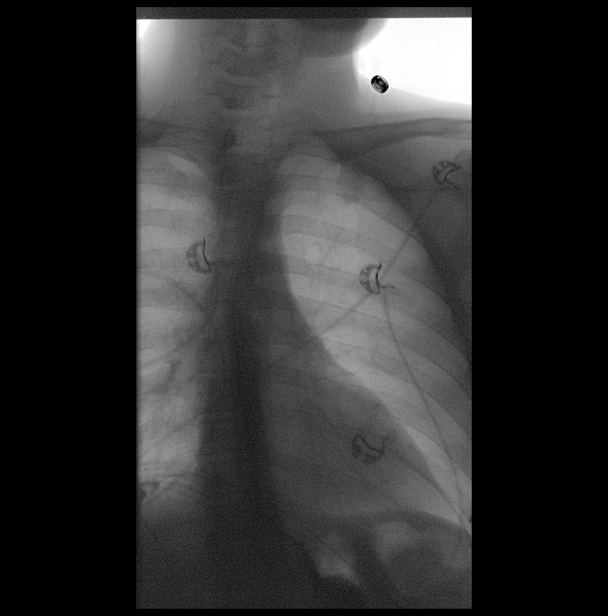
[frame 131/154]
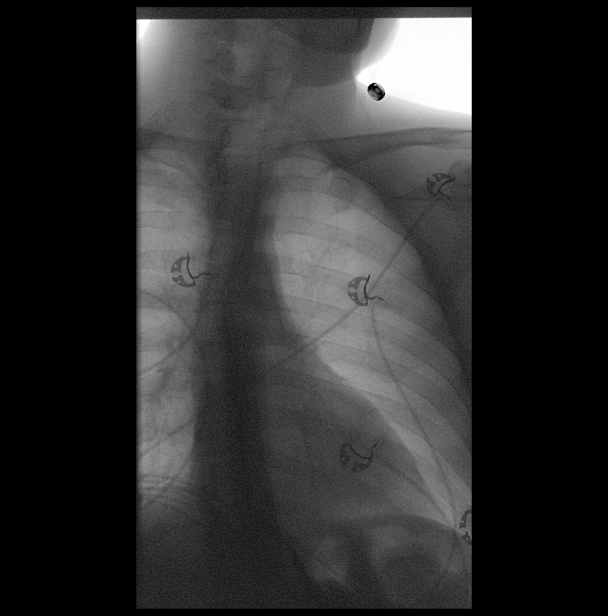
[frame 134/154]
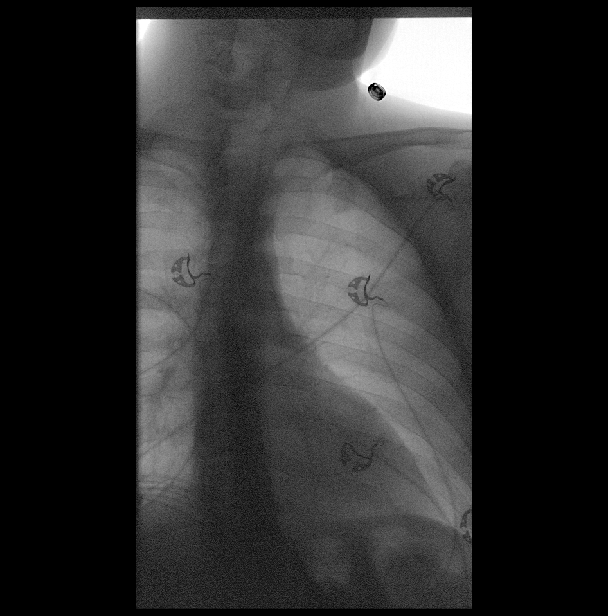

[Series 3: cp_standard · 3 of 169 frames shown (3 of 4)]
[frame 26/169]
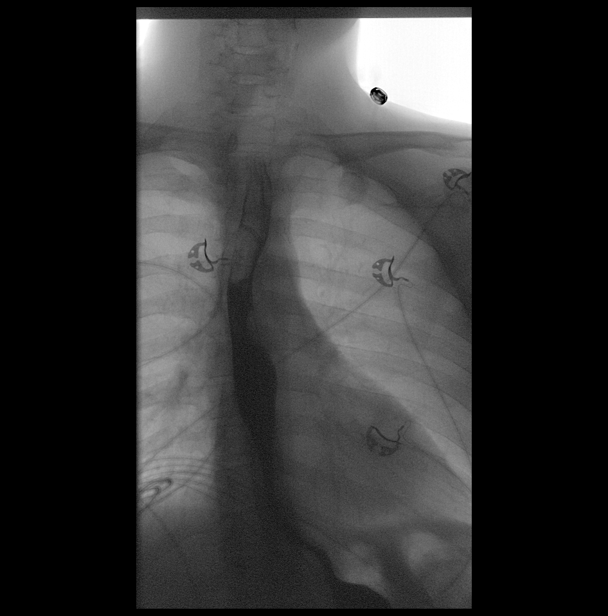
[frame 85/169]
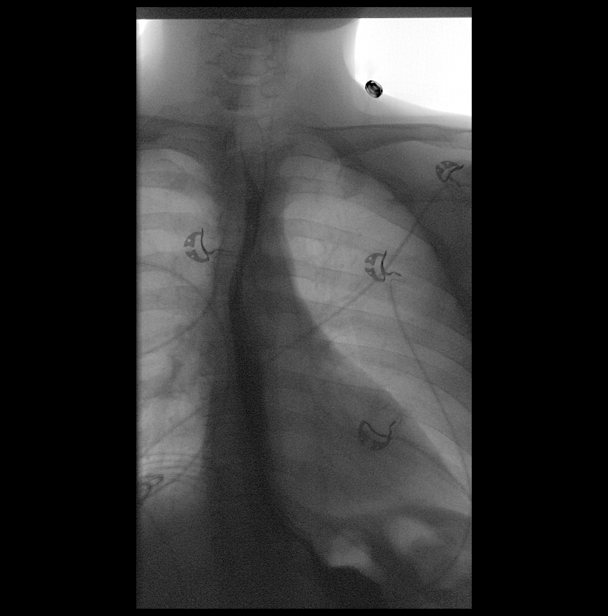
[frame 122/169]
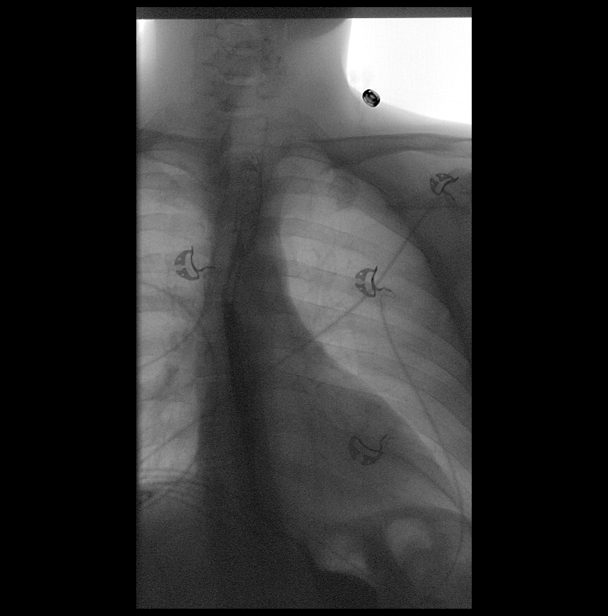

[Series 4: cp_standard · 4 of 114 frames shown (4 of 4)]
[frame 5/114]
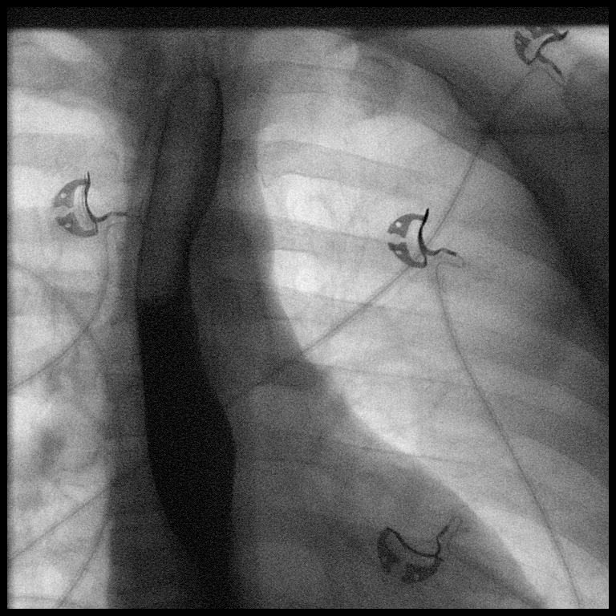
[frame 18/114]
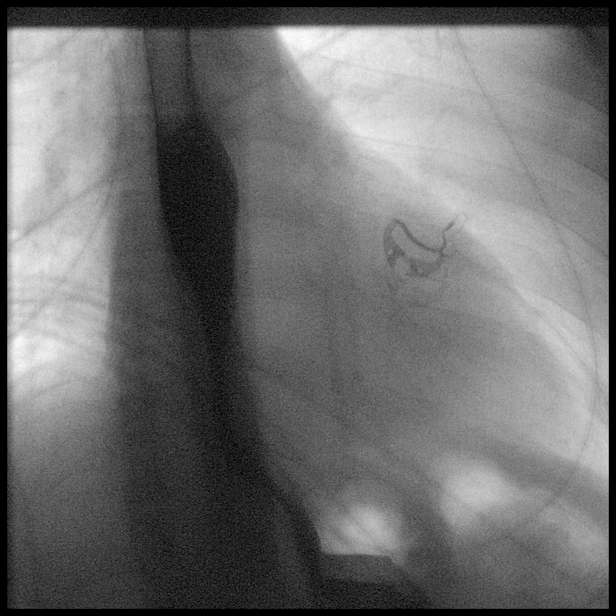
[frame 58/114]
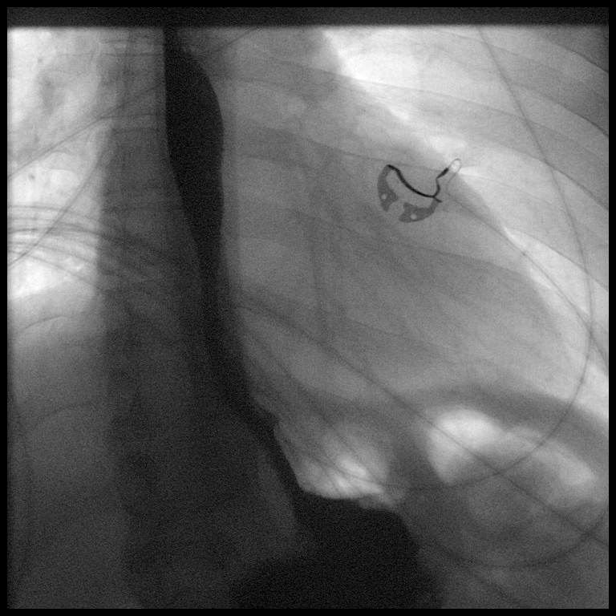
[frame 97/114]
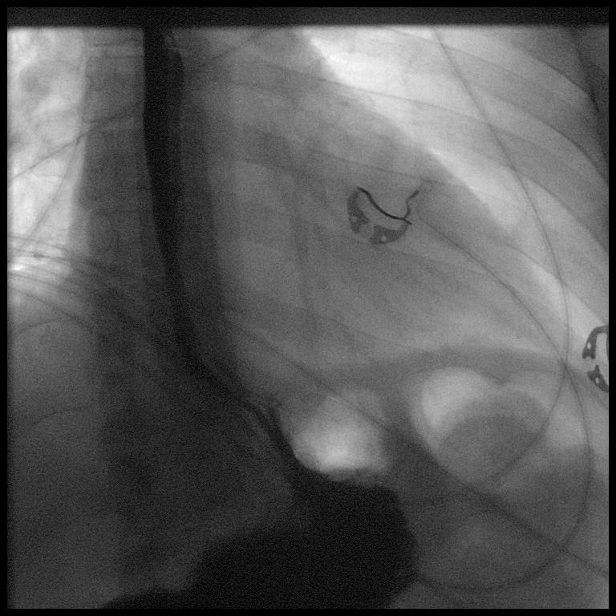

[14 of 16 positions shown; findings below may reference images not displayed]

FINDINGS: Initial swallows with water-soluble contrast demonstrate no evidence
of perforation. No aspiration. There is persistent narrowing of the
esophagus distally with some holdup but no obstruction. This appears
to be symptomatic. No hiatal hernia.
IMPRESSION: Persistent narrowing of the distal esophagus with resulting holdup
but no obstruction to contrast passage. May reflect esophagitis
and/or stricture. Of note, there was severe lower esophageal wall
thickening on a December 2020 chest CT.
